# Patient Record
Sex: Female | Born: 1975 | Race: White | Hispanic: No | Marital: Married | Smoking: Never smoker
Health system: Southern US, Community
[De-identification: ages and names within clinical notes are randomized; demographics above are authoritative.]

## PROBLEM LIST (undated history)

## (undated) DIAGNOSIS — G43909 Migraine, unspecified, not intractable, without status migrainosus: Secondary | ICD-10-CM

## (undated) DIAGNOSIS — F419 Anxiety disorder, unspecified: Secondary | ICD-10-CM

## (undated) DIAGNOSIS — R112 Nausea with vomiting, unspecified: Secondary | ICD-10-CM

## (undated) DIAGNOSIS — R002 Palpitations: Secondary | ICD-10-CM

## (undated) DIAGNOSIS — E079 Disorder of thyroid, unspecified: Secondary | ICD-10-CM

## (undated) DIAGNOSIS — I471 Supraventricular tachycardia, unspecified: Secondary | ICD-10-CM

## (undated) DIAGNOSIS — F32A Depression, unspecified: Secondary | ICD-10-CM

## (undated) HISTORY — DX: Migraine, unspecified, not intractable, without status migrainosus: G43.909

## (undated) HISTORY — PX: COLONOSCOPY: SHX174

## (undated) HISTORY — DX: Disorder of thyroid, unspecified: E07.9

## (undated) HISTORY — DX: Anxiety disorder, unspecified: F41.9

---
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
## 2001-01-06 ENCOUNTER — Other Ambulatory Visit: Admission: RE | Admit: 2001-01-06 | Discharge: 2001-01-06 | Payer: Self-pay | Admitting: Obstetrics and Gynecology

## 2001-11-19 ENCOUNTER — Emergency Department (HOSPITAL_COMMUNITY): Admission: EM | Admit: 2001-11-19 | Discharge: 2001-11-19 | Payer: Self-pay | Admitting: Emergency Medicine

## 2002-01-11 ENCOUNTER — Other Ambulatory Visit: Admission: RE | Admit: 2002-01-11 | Discharge: 2002-01-11 | Payer: Self-pay | Admitting: Obstetrics and Gynecology

## 2002-01-16 ENCOUNTER — Encounter: Payer: Self-pay | Admitting: Obstetrics and Gynecology

## 2002-01-16 ENCOUNTER — Encounter: Admission: RE | Admit: 2002-01-16 | Discharge: 2002-01-16 | Payer: Self-pay | Admitting: Obstetrics and Gynecology

## 2003-01-26 ENCOUNTER — Other Ambulatory Visit: Admission: RE | Admit: 2003-01-26 | Discharge: 2003-01-26 | Payer: Self-pay | Admitting: Obstetrics and Gynecology

## 2003-12-15 ENCOUNTER — Inpatient Hospital Stay (HOSPITAL_COMMUNITY): Admission: AD | Admit: 2003-12-15 | Discharge: 2003-12-15 | Payer: Self-pay | Admitting: Obstetrics and Gynecology

## 2004-01-29 ENCOUNTER — Other Ambulatory Visit: Admission: RE | Admit: 2004-01-29 | Discharge: 2004-01-29 | Payer: Self-pay | Admitting: Obstetrics and Gynecology

## 2004-01-31 ENCOUNTER — Ambulatory Visit (HOSPITAL_COMMUNITY): Admission: RE | Admit: 2004-01-31 | Discharge: 2004-01-31 | Payer: Self-pay | Admitting: Obstetrics and Gynecology

## 2005-01-30 ENCOUNTER — Other Ambulatory Visit: Admission: RE | Admit: 2005-01-30 | Discharge: 2005-01-30 | Payer: Self-pay | Admitting: Obstetrics and Gynecology

## 2006-01-26 ENCOUNTER — Other Ambulatory Visit: Admission: RE | Admit: 2006-01-26 | Discharge: 2006-01-26 | Payer: Self-pay | Admitting: Obstetrics and Gynecology

## 2007-02-18 ENCOUNTER — Inpatient Hospital Stay (HOSPITAL_COMMUNITY): Admission: AD | Admit: 2007-02-18 | Discharge: 2007-02-20 | Payer: Self-pay | Admitting: Obstetrics and Gynecology

## 2008-11-30 HISTORY — PX: OTHER SURGICAL HISTORY: SHX169

## 2009-04-10 ENCOUNTER — Encounter: Admission: RE | Admit: 2009-04-10 | Discharge: 2009-04-10 | Payer: Self-pay | Admitting: Family Medicine

## 2009-09-11 ENCOUNTER — Ambulatory Visit (HOSPITAL_COMMUNITY): Admission: AD | Admit: 2009-09-11 | Discharge: 2009-09-11 | Payer: Self-pay | Admitting: Obstetrics and Gynecology

## 2009-09-11 ENCOUNTER — Encounter (INDEPENDENT_AMBULATORY_CARE_PROVIDER_SITE_OTHER): Payer: Self-pay | Admitting: Obstetrics and Gynecology

## 2010-10-31 ENCOUNTER — Inpatient Hospital Stay (HOSPITAL_COMMUNITY): Admission: AD | Admit: 2010-10-31 | Discharge: 2010-11-02 | Payer: Self-pay | Admitting: Obstetrics and Gynecology

## 2010-11-04 ENCOUNTER — Encounter
Admission: RE | Admit: 2010-11-04 | Discharge: 2010-12-04 | Payer: Self-pay | Source: Home / Self Care | Attending: Obstetrics and Gynecology | Admitting: Obstetrics and Gynecology

## 2011-02-09 LAB — CBC
HCT: 33.1 % — ABNORMAL LOW (ref 36.0–46.0)
HCT: 39.4 % (ref 36.0–46.0)
Hemoglobin: 11.2 g/dL — ABNORMAL LOW (ref 12.0–15.0)
Hemoglobin: 13.1 g/dL (ref 12.0–15.0)
MCV: 93.3 fL (ref 78.0–100.0)
Platelets: 166 10*3/uL (ref 150–400)
RBC: 3.55 MIL/uL — ABNORMAL LOW (ref 3.87–5.11)
WBC: 14.1 10*3/uL — ABNORMAL HIGH (ref 4.0–10.5)
WBC: 17.9 10*3/uL — ABNORMAL HIGH (ref 4.0–10.5)

## 2011-03-05 LAB — CBC
Hemoglobin: 12.6 g/dL (ref 12.0–15.0)
MCHC: 34.2 g/dL (ref 30.0–36.0)
MCV: 89.4 fL (ref 78.0–100.0)
RBC: 4.13 MIL/uL (ref 3.87–5.11)

## 2013-02-16 ENCOUNTER — Other Ambulatory Visit: Payer: Self-pay | Admitting: Obstetrics and Gynecology

## 2013-02-17 ENCOUNTER — Ambulatory Visit
Admission: RE | Admit: 2013-02-17 | Discharge: 2013-02-17 | Disposition: A | Discharge status: Home / Self Care | Payer: BC Managed Care – PPO | Source: Ambulatory Visit | Attending: Obstetrics and Gynecology | Admitting: Obstetrics and Gynecology

## 2013-02-17 DIAGNOSIS — N63 Unspecified lump in unspecified breast: Secondary | ICD-10-CM

## 2013-04-14 ENCOUNTER — Other Ambulatory Visit: Payer: Self-pay | Admitting: Dermatology

## 2013-06-19 ENCOUNTER — Ambulatory Visit: Payer: Self-pay | Admitting: Obstetrics and Gynecology

## 2013-06-30 ENCOUNTER — Encounter: Payer: Self-pay | Admitting: Obstetrics and Gynecology

## 2013-06-30 ENCOUNTER — Ambulatory Visit (INDEPENDENT_AMBULATORY_CARE_PROVIDER_SITE_OTHER): Payer: BC Managed Care – PPO | Admitting: Obstetrics and Gynecology

## 2013-06-30 VITALS — BP 100/60 | HR 64 | Ht 63.5 in | Wt 126.0 lb

## 2013-06-30 DIAGNOSIS — Z8669 Personal history of other diseases of the nervous system and sense organs: Secondary | ICD-10-CM

## 2013-06-30 DIAGNOSIS — Z0000 Encounter for general adult medical examination without abnormal findings: Secondary | ICD-10-CM

## 2013-06-30 DIAGNOSIS — Z01419 Encounter for gynecological examination (general) (routine) without abnormal findings: Secondary | ICD-10-CM

## 2013-06-30 LAB — HEMOGLOBIN, FINGERSTICK: Hemoglobin, fingerstick: 14 g/dL (ref 12.0–16.0)

## 2013-06-30 LAB — POCT URINALYSIS DIPSTICK
Ketones, UA: NEGATIVE
Protein, UA: NEGATIVE

## 2013-06-30 NOTE — Progress Notes (Signed)
Patient ID: Katelyn George, female   DOB: 09-11-1976, 37 y.o.   MRN: [id number] 37 y.o.   Married    Caucasian   female   [id number]   here for annual exam.    Has migraine headaches.  Gets headaches a lot - uses medication 5 - 6 times a month. Doesn't tolerate Imitrex. Excedrin migraine has worked.    Prozac 20 mg has been working well.  See a psychiatrist, Dr. Nolen Mu.  Normal menses, every 3 - 4 weeks.  No significant bleeding or cramping. Vasectomy for birth control.  Patient's last menstrual period was 06/12/2013.          Sexually active: yes  The current method of family planning is vasectomy.    Exercising: running and pilates Last mammogram:  02/2013 wnl Last pap smear: 05/2012 wnl: no HR HPV done History of abnormal pap: 16 years ago had colposcopy with cryotherapy and paps normal since. Smoking: no Alcohol: 1-2 beers per week Last colonoscopy: never Last Bone Density:  never Last tetanus shot: 2009 Last cholesterol check: 2013 - T chol - 167, HDL 56, LDL 97, TG 69. TSH, CBC, CMP, HgbA1C  - 2013 also normal  Hgb:  14.0              Urine: Neg   Family History  Problem Relation Age of Onset  . Thyroid disease Mother   . Migraines Maternal Grandmother   . Diabetes Maternal Grandfather   . Stroke Maternal Grandfather     There are no active problems to display for this patient.   Past Medical History  Diagnosis Date  . Migraines   . Anxiety     Past Surgical History  Procedure Laterality Date  . Cervix lesion destruction  1990  . Dilatation and curettage  2010    Allergies: Sulfa antibiotics  Current Outpatient Prescriptions  Medication Sig Dispense Refill  . butalbital-acetaminophen-caffeine (FIORICET WITH CODEINE) 50-325-40-30 MG per capsule Take 1 capsule by mouth every 4 (four) hours as needed for headache.      Marland Kitchen FLUoxetine (PROZAC) 20 MG capsule Take 1 capsule by mouth daily.       No current facility-administered medications for this visit.     ROS: Pertinent items are noted in HPI.  Social Hx:  Married.  Full time mom.  Exam:    BP 100/60  Pulse 64  Ht 5' 3.5" (1.613 m)  Wt 126 lb (57.153 kg)  BMI 21.97 kg/m2  LMP 06/12/2013   Wt Readings from Last 3 Encounters:  06/30/13 126 lb (57.153 kg)     Ht Readings from Last 3 Encounters:  06/30/13 5' 3.5" (1.613 m)    General appearance: alert, cooperative and appears stated age Head: Normocephalic, without obvious abnormality, atraumatic Neck: no adenopathy, supple, symmetrical, trachea midline and thyroid not enlarged, symmetric, no tenderness/mass/nodules Lungs: clear to auscultation bilaterally Breasts: Inspection negative, No nipple retraction or dimpling, No nipple discharge or bleeding, No axillary or supraclavicular adenopathy, Normal to palpation without dominant masses Heart: regular rate and rhythm Abdomen: soft, non-tender; no masses,  no organomegaly Extremities: extremities normal, atraumatic, no cyanosis or edema Skin: Skin color, texture, turgor normal. No rashes or lesions Lymph nodes: Cervical, supraclavicular, and axillary nodes normal. No abnormal inguinal nodes palpated Neurologic: Grossly normal   Pelvic: External genitalia:  no lesions              Urethra:  normal appearing urethra with no masses, tenderness or lesions  Bartholins and Skenes: normal                 Vagina: normal appearing vagina with normal color and discharge, no lesions              Cervix: normal appearance              Pap taken: yes and high risk HPV testing.        Bimanual Exam:  Uterus:  uterus is normal size, shape, consistency and nontender                                      Adnexa: normal adnexa in size, nontender and no masses                                         Assessment  Normal gynecologic exam. Migraine headaches.   Plan  Pap and high risk HPV testing. [facility].  An After Visit Summary was  printed and given to the patient.

## 2013-06-30 NOTE — Patient Instructions (Signed)

## 2013-07-03 ENCOUNTER — Other Ambulatory Visit: Payer: Self-pay | Admitting: Obstetrics and Gynecology

## 2013-07-03 DIAGNOSIS — Z124 Encounter for screening for malignant neoplasm of cervix: Secondary | ICD-10-CM

## 2013-07-05 NOTE — Addendum Note (Signed)
Addended by: Conley Simmonds on: 07/05/2013 08:41 AM   Modules accepted: Orders

## 2013-07-17 ENCOUNTER — Telehealth: Payer: Self-pay | Admitting: Obstetrics and Gynecology

## 2013-07-17 NOTE — Telephone Encounter (Signed)
Patient calling for pap results please?

## 2013-07-17 NOTE — Telephone Encounter (Signed)
Spoke with pt to advise her Pap was negative, as well as HPV testing. Pt appreciative.

## 2013-10-05 ENCOUNTER — Other Ambulatory Visit: Payer: Self-pay

## 2014-07-02 ENCOUNTER — Ambulatory Visit: Payer: BC Managed Care – PPO | Admitting: Obstetrics and Gynecology

## 2014-07-04 ENCOUNTER — Ambulatory Visit: Payer: BC Managed Care – PPO | Admitting: Obstetrics and Gynecology

## 2014-08-10 ENCOUNTER — Ambulatory Visit: Payer: BC Managed Care – PPO | Admitting: Obstetrics and Gynecology

## 2014-08-29 ENCOUNTER — Encounter: Payer: Self-pay | Admitting: Obstetrics and Gynecology

## 2014-08-29 ENCOUNTER — Ambulatory Visit (INDEPENDENT_AMBULATORY_CARE_PROVIDER_SITE_OTHER): Payer: PRIVATE HEALTH INSURANCE | Admitting: Obstetrics and Gynecology

## 2014-08-29 VITALS — BP 98/60 | HR 80 | Resp 20 | Ht 63.0 in | Wt 137.0 lb

## 2014-08-29 DIAGNOSIS — Z0000 Encounter for general adult medical examination without abnormal findings: Secondary | ICD-10-CM

## 2014-08-29 DIAGNOSIS — Z01419 Encounter for gynecological examination (general) (routine) without abnormal findings: Secondary | ICD-10-CM

## 2014-08-29 LAB — CBC
HEMATOCRIT: 41.1 % (ref 36.0–46.0)
Hemoglobin: 13.6 g/dL (ref 12.0–15.0)
MCH: 29.1 pg (ref 26.0–34.0)
MCHC: 33.1 g/dL (ref 30.0–36.0)
MCV: 88 fL (ref 78.0–100.0)
Platelets: 333 10*3/uL (ref 150–400)
RBC: 4.67 MIL/uL (ref 3.87–5.11)
RDW: 13.7 % (ref 11.5–15.5)
WBC: 6.6 10*3/uL (ref 4.0–10.5)

## 2014-08-29 LAB — POCT URINALYSIS DIPSTICK
BILIRUBIN UA: NEGATIVE
Glucose, UA: NEGATIVE
KETONES UA: NEGATIVE
Leukocytes, UA: NEGATIVE
Nitrite, UA: NEGATIVE
PH UA: 5
PROTEIN UA: NEGATIVE
RBC UA: NEGATIVE
Urobilinogen, UA: NEGATIVE

## 2014-08-29 LAB — COMPREHENSIVE METABOLIC PANEL
ALK PHOS: 43 U/L (ref 39–117)
AST: 16 U/L (ref 0–37)
Albumin: 4.4 g/dL (ref 3.5–5.2)
BILIRUBIN TOTAL: 0.5 mg/dL (ref 0.2–1.2)
BUN: 9 mg/dL (ref 6–23)
CO2: 22 mEq/L (ref 19–32)
CREATININE: 0.94 mg/dL (ref 0.50–1.10)
Calcium: 9.5 mg/dL (ref 8.4–10.5)
Chloride: 101 mEq/L (ref 96–112)
Glucose, Bld: 84 mg/dL (ref 70–99)
Potassium: 4.9 mEq/L (ref 3.5–5.3)
SODIUM: 137 meq/L (ref 135–145)
TOTAL PROTEIN: 6.7 g/dL (ref 6.0–8.3)

## 2014-08-29 LAB — LIPID PANEL
Cholesterol: 178 mg/dL (ref 0–200)
HDL: 66 mg/dL (ref >39)
LDL CALC: 88 mg/dL (ref 0–99)
TRIGLYCERIDES: 122 mg/dL (ref <150)
Total CHOL/HDL Ratio: 2.7 Ratio
VLDL: 24 mg/dL (ref 0–40)

## 2014-08-29 LAB — HEMOGLOBIN, FINGERSTICK: Hemoglobin, fingerstick: 13.5 g/dL (ref 12.0–16.0)

## 2014-08-29 NOTE — Progress Notes (Signed)
Patient ID: Katelyn FussSarah R Pedrosa, female   DOB: 03/04/1976, 38 y.o.   MRN: [id number] GYNECOLOGY VISIT  PCP:   Referring provider:   HPI: 38 y.o.   Married  Caucasian  female   [phone] with Patient's last menstrual period was 08/29/2014.  - just starting.  here for  AEX.  Clotting a little bit more with menses.   History of postpartum depression.   Patient is wanting general blood work.  Father is now pre-diabetic.   Hgb:    13.5 Urine:   Neg  GYNECOLOGIC HISTORY: Patient's last menstrual period was 08/29/2014. Sexually active:  yes Partner preference: female Contraception:  vasectomy  Menopausal hormone therapy: n/a DES exposure: no   Blood transfusions:  no  Sexually transmitted diseases:  no GYN procedures and prior surgeries:  Colposcopy and cryotherapy to cervix 1998 Last mammogram: 02/2013 wnl (done for palpable mass on exam)              Last pap and high risk HPV testing: 07-05-13 wnl:neg HR HPV History of abnormal pap smear: 1998 hx of colposcopy and cryotherapy to cervix. - thinks this was for mild dysplasia.  Follow up paps were normal.    OB History   Grav Para Term Preterm Abortions TAB SAB Ect Mult Living   3 2 2  1  1   2        LIFESTYLE: Exercise:  Running/walking/pilates              OTHER HEALTH MAINTENANCE: Tetanus/TDap:  2009 HPV:                  n/a Influenza:           08/2013   Bone density:      n/a Colonoscopy:      n/a  Cholesterol check:  2013 - Total:167, HDL 56, LDL:97, TG:69  Family History  Problem Relation Age of Onset  . Thyroid disease Mother   . Migraines Maternal Grandmother   . Diabetes Maternal Grandfather   . Stroke Maternal Grandfather     There are no active problems to display for this patient.  Past Medical History  Diagnosis Date  . Migraines   . Anxiety     Past Surgical History  Procedure Laterality Date  . Cervix lesion destruction  1990  . Dilatation and curettage  2010    ALLERGIES: Sulfa  antibiotics  Current Outpatient Prescriptions  Medication Sig Dispense Refill  . FLUoxetine (PROZAC) 20 MG capsule Take 1 capsule by mouth daily.       No current facility-administered medications for this visit.     ROS:  Pertinent items are noted in HPI.  History   Social History  . Marital Status: Married    Spouse Name: N/A    Number of Children: N/A  . Years of Education: N/A   Occupational History  . Not on file.   Social History Main Topics  . Smoking status: Never Smoker   . Smokeless tobacco: Not on file  . Alcohol Use: 0.5 oz/week    1 drink(s) per week     Comment: 1-2 glasses of wine per week  . Drug Use: No  . Sexual Activity: Yes    Partners: Male    Birth Control/ Protection: Other-see comments     Comment: vasectomy   Other Topics Concern  . Not on file   Social History Narrative  . No narrative on file    PHYSICAL EXAMINATION:  BP 98/60  Pulse 80  Resp 20  Ht 5\' 3"  (1.6 m)  Wt 137 lb (62.143 kg)  BMI 24.27 kg/m2  LMP 08/29/2014   Wt Readings from Last 3 Encounters:  08/29/14 137 lb (62.143 kg)  06/30/13 126 lb (57.153 kg)     Ht Readings from Last 3 Encounters:  08/29/14 5\' 3"  (1.6 m)  06/30/13 5' 3.5" (1.613 m)    General appearance: alert, cooperative and appears stated age Head: Normocephalic, without obvious abnormality, atraumatic Neck: no adenopathy, supple, symmetrical, trachea midline and thyroid not enlarged, symmetric, no tenderness/mass/nodules Lungs: clear to auscultation bilaterally Breasts: Inspection negative, No nipple retraction or dimpling, No nipple discharge or bleeding, No axillary or supraclavicular adenopathy, Normal to palpation without dominant masses Heart: regular rate and rhythm Abdomen: soft, non-tender; no masses,  no organomegaly Extremities: extremities normal, atraumatic, no cyanosis or edema Skin: Skin color, texture, turgor normal. No rashes or lesions Lymph nodes: Cervical, supraclavicular, and  axillary nodes normal. No abnormal inguinal nodes palpated Neurologic: Grossly normal  Pelvic: External genitalia:  no lesions              Urethra:  normal appearing urethra with no masses, tenderness or lesions              Bartholins and Skenes: normal                 Vagina: normal appearing vagina with normal color and discharge, no lesions              Cervix: normal appearance              Pap and high risk HPV testing done: No.        Bimanual Exam:  Uterus:  uterus is normal size, shape, consistency and nontender                                      Adnexa: normal adnexa in size, nontender and no masses                                  ASSESSMENT  Normal gynecologic exam. Remote history of low grade dysplasia.   PLAN  Mammogram recommended yearly starting at age 29. Pap smear and high risk HPV testing as above. Counseled on self breast exam, Calcium and vitamin D intake, exercise. See lab orders: Yes.   Return annually or prn   An After Visit Summary was printed and given to the patient.

## 2014-08-29 NOTE — Patient Instructions (Signed)

## 2014-10-01 ENCOUNTER — Encounter: Payer: Self-pay | Admitting: Obstetrics and Gynecology

## 2015-09-11 ENCOUNTER — Ambulatory Visit (INDEPENDENT_AMBULATORY_CARE_PROVIDER_SITE_OTHER): Payer: PRIVATE HEALTH INSURANCE | Admitting: Obstetrics and Gynecology

## 2015-09-11 ENCOUNTER — Encounter: Payer: Self-pay | Admitting: Obstetrics and Gynecology

## 2015-09-11 VITALS — BP 86/60 | HR 84 | Resp 16 | Ht 63.25 in | Wt 147.0 lb

## 2015-09-11 DIAGNOSIS — Z634 Disappearance and death of family member: Secondary | ICD-10-CM | POA: Diagnosis not present

## 2015-09-11 DIAGNOSIS — Z0000 Encounter for general adult medical examination without abnormal findings: Secondary | ICD-10-CM | POA: Diagnosis not present

## 2015-09-11 DIAGNOSIS — R5383 Other fatigue: Secondary | ICD-10-CM | POA: Diagnosis not present

## 2015-09-11 DIAGNOSIS — Z01419 Encounter for gynecological examination (general) (routine) without abnormal findings: Secondary | ICD-10-CM

## 2015-09-11 LAB — COMPREHENSIVE METABOLIC PANEL
ALK PHOS: 53 U/L (ref 33–115)
ALT: 17 U/L (ref 6–29)
AST: 20 U/L (ref 10–30)
Albumin: 4.3 g/dL (ref 3.6–5.1)
BILIRUBIN TOTAL: 0.5 mg/dL (ref 0.2–1.2)
BUN: 12 mg/dL (ref 7–25)
CO2: 28 mmol/L (ref 20–31)
CREATININE: 0.89 mg/dL (ref 0.50–1.10)
Calcium: 9.4 mg/dL (ref 8.6–10.2)
Chloride: 101 mmol/L (ref 98–110)
GLUCOSE: 88 mg/dL (ref 65–99)
POTASSIUM: 4.3 mmol/L (ref 3.5–5.3)
Sodium: 137 mmol/L (ref 135–146)
TOTAL PROTEIN: 7 g/dL (ref 6.1–8.1)

## 2015-09-11 LAB — POCT URINALYSIS DIPSTICK
BILIRUBIN UA: NEGATIVE
GLUCOSE UA: NEGATIVE
Ketones, UA: NEGATIVE
LEUKOCYTES UA: NEGATIVE
NITRITE UA: NEGATIVE
Protein, UA: NEGATIVE
RBC UA: NEGATIVE
UROBILINOGEN UA: NEGATIVE
pH, UA: 5

## 2015-09-11 LAB — LIPID PANEL
CHOL/HDL RATIO: 3 ratio (ref <=5.0)
CHOLESTEROL: 194 mg/dL (ref 125–200)
HDL: 65 mg/dL (ref >=46)
LDL Cholesterol: 104 mg/dL (ref <130)
TRIGLYCERIDES: 126 mg/dL (ref <150)
VLDL: 25 mg/dL (ref <30)

## 2015-09-11 LAB — CBC
HCT: 40.3 % (ref 36.0–46.0)
Hemoglobin: 13.7 g/dL (ref 12.0–15.0)
MCH: 29.5 pg (ref 26.0–34.0)
MCHC: 34 g/dL (ref 30.0–36.0)
MCV: 86.9 fL (ref 78.0–100.0)
MPV: 9.7 fL (ref 8.6–12.4)
PLATELETS: 308 10*3/uL (ref 150–400)
RBC: 4.64 MIL/uL (ref 3.87–5.11)
RDW: 13.6 % (ref 11.5–15.5)
WBC: 5.9 10*3/uL (ref 4.0–10.5)

## 2015-09-11 LAB — THYROID PANEL WITH TSH
FREE THYROXINE INDEX: 1.8 (ref 1.4–3.8)
T3 UPTAKE: 26 % (ref 22–35)
T4, Total: 6.8 ug/dL (ref 4.5–12.0)
TSH: 3.219 u[IU]/mL (ref 0.350–4.500)

## 2015-09-11 LAB — HEMOGLOBIN, FINGERSTICK: HEMOGLOBIN, FINGERSTICK: 13.7 g/dL (ref 12.0–16.0)

## 2015-09-11 NOTE — Progress Notes (Signed)
39 y.o. G45P2012 Married Caucasian female here for annual exam.    Father deceased in 2015/04/02.  Suicide.  Patient is an only child.  Taking care of her mother.   Taking Prozac and feels stable at this time.  Has a counselor and religious support group.  No suicidal thoughts or ideation.   Feels really tired.   Wants thyroid checked.   Menses wax and wane in heaviness.  Nothing concerning for patient.  Cycles are regular.  PCP: No PCP    Patient's last menstrual period was 08/21/2015 (approximate).          Sexually active: Yes.    The current method of family planning is vasectomy.    Exercising: Yes.    Pilates, walking Smoker:  no  Health Maintenance: Pap:  07/05/13 Neg. HR HPV:neg History of abnormal Pap:  Yes, 1998 colpo and cryotherapy. Mild dysplasia MMG:  02/17/13 [place] Right BIRADS1:neg TDaP:  04/01/08 Screening Labs:  Hb today: 13.7, Urine today: Negative   reports that she has never smoked. She has never used smokeless tobacco. She reports that she drinks about 0.5 oz of alcohol per week. She reports that she does not use illicit drugs.  Past Medical History  Diagnosis Date  . Migraines   . Anxiety     Past Surgical History  Procedure Laterality Date  . Cervix lesion destruction  1990  . Dilatation and curettage  04-01-2009    Current Outpatient Prescriptions  Medication Sig Dispense Refill  . FLUoxetine (PROZAC) 20 MG capsule Take 1 capsule by mouth daily.    Marland Kitchen gabapentin (NEURONTIN) 100 MG capsule Take 100 mg by mouth at bedtime as needed. for sleep  5  . vitamin B-12 (CYANOCOBALAMIN) 100 MCG tablet Take 100 mcg by mouth daily.     No current facility-administered medications for this visit.    Family History  Problem Relation Age of Onset  . Thyroid disease Mother   . Migraines Maternal Grandmother   . Diabetes Maternal Grandfather   . Stroke Maternal Grandfather   . Depression Father     suicide  . Bipolar disorder Father     ROS:  Pertinent items are  noted in HPI.  Otherwise, a comprehensive ROS was negative.  Exam:   BP 86/60 mmHg  Pulse 84  Resp 16  Ht 5' 3.25" (1.607 m)  Wt 147 lb (66.679 kg)  BMI 25.82 kg/m2  LMP 08/21/2015 (Approximate)    General appearance: alert, cooperative and appears stated age Head: Normocephalic, without obvious abnormality, atraumatic Neck: no adenopathy, supple, symmetrical, trachea midline and thyroid normal to inspection and palpation Lungs: clear to auscultation bilaterally Breasts: normal appearance, no masses or tenderness, Inspection negative, No nipple retraction or dimpling, No nipple discharge or bleeding, No axillary or supraclavicular adenopathy Heart: regular rate and rhythm Abdomen: soft, non-tender; bowel sounds normal; no masses,  no organomegaly Extremities: extremities normal, atraumatic, no cyanosis or edema Skin: Skin color, texture, turgor normal. No rashes or lesions Lymph nodes: Cervical, supraclavicular, and axillary nodes normal. No abnormal inguinal nodes palpated Neurologic: Grossly normal  Pelvic: External genitalia:  no lesions              Urethra:  normal appearing urethra with no masses, tenderness or lesions              Bartholins and Skenes: normal                 Vagina: normal appearing vagina with normal color  and discharge, no lesions              Cervix: no lesions              Pap taken: No. Bimanual Exam:  Uterus:  normal size, contour, position, consistency, mobility, non-tender.  Uterus feels like it is tilted to the right so I feel the fundus more on the right side of pelvis.              Adnexa: normal adnexa and no mass, fullness, tenderness              Rectovaginal: No..  Confirms.              Anus:  normal sphincter tone, no lesions  Chaperone was present for exam.  Assessment:   Well woman visit with normal exam. Bereavement.  Depression/anxiety.  Controlled with Prozac.   Fatigue.  Remote hx of cervical dysplasia.   Plan: Yearly  mammogram recommended after age 39.  Recommended self breast exam.  Pap and HR HPV as above.  Co-testing next year. Discussed Calcium, Vitamin D, regular exercise program including cardiovascular and weight bearing exercise. Labs performed.  Yes.  .   See orders. Support give for death of father.  Refills given on medications.  No..    Follow up annually and prn.    After visit summary provided.

## 2015-09-11 NOTE — Patient Instructions (Signed)

## 2015-09-12 ENCOUNTER — Other Ambulatory Visit: Payer: Self-pay | Admitting: Obstetrics and Gynecology

## 2015-09-12 DIAGNOSIS — E559 Vitamin D deficiency, unspecified: Secondary | ICD-10-CM

## 2015-09-12 LAB — VITAMIN D 25 HYDROXY (VIT D DEFICIENCY, FRACTURES): VIT D 25 HYDROXY: 28 ng/mL — AB (ref 30–100)

## 2016-01-20 ENCOUNTER — Telehealth: Payer: Self-pay

## 2016-01-20 NOTE — Telephone Encounter (Signed)
Spoke with patient. Advised of message as seen below from Dr.Silva. Patient states she will need to check her calendar and return call to schedule.  Routing to provider for final review. Patient agreeable to disposition. Will close encounter.

## 2016-01-20 NOTE — Telephone Encounter (Signed)
-----   MesPatton Sallesndson C Silva, MD sent at 01/18/2016 10:03 AM EST ----- Regarding: overdue for vit D recheck Please let patient know that she is overdue for her vitamin D recheck.  This came up in my reminder box in [employer].   Thank you,   Brook ----- Message -----    From: SYSTEM    Sent: 01/18/2016  12:04 AM      To: Patton Salles, MD

## 2016-09-28 NOTE — Progress Notes (Deleted)
40 y.o. 413P2012 Married Caucasian female here for annual exam.    PCP:     No LMP recorded.           Sexually active: {yes no:314532}  The current method of family planning is vasectomy.    Exercising: {yes no:314532}  {types:19826} Smoker:  no  Health Maintenance: Pap: 07/05/13 Neg. HR HPV:neg  History of abnormal Pap:  Yes, 1998 colpo and cryotherapy. Mild dysplasia MMG:  02-17-13 Bil.Diag.and Rt.US--Cat.3/Neg/BiRads1/rec.screening at age 40:[facility] Colonoscopy:  n/a BMD:   n/a  Result  n/a TDaP:  2009 Gardasil:   {YES NO:22349} HIV: Hep C: Screening Labs:  Hb today: ***, Urine today: ***   reports that she has never smoked. She has never used smokeless tobacco. She reports that she drinks about 0.5 oz of alcohol per week . She reports that she does not use drugs.  Past Medical History:  Diagnosis Date  . Anxiety   . Migraines     Past Surgical History:  Procedure Laterality Date  . CERVIX LESION DESTRUCTION  1990  . dilatation and curettage  2010    Current Outpatient Prescriptions  Medication Sig Dispense Refill  . FLUoxetine (PROZAC) 20 MG capsule Take 1 capsule by mouth daily.    Marland Kitchen. gabapentin (NEURONTIN) 100 MG capsule Take 100 mg by mouth at bedtime as needed. for sleep  5  . vitamin B-12 (CYANOCOBALAMIN) 100 MCG tablet Take 100 mcg by mouth daily.     No current facility-administered medications for this visit.     Family History  Problem Relation Age of Onset  . Thyroid disease Mother   . Migraines Maternal Grandmother   . Diabetes Maternal Grandfather   . Stroke Maternal Grandfather   . Depression Father     suicide  . Bipolar disorder Father     ROS:  Pertinent items are noted in HPI.  Otherwise, a comprehensive ROS was negative.  Exam:   There were no vitals taken for this visit.    General appearance: alert, cooperative and appears stated age Head: Normocephalic, without obvious abnormality, atraumatic Neck: no adenopathy, supple,  symmetrical, trachea midline and thyroid normal to inspection and palpation Lungs: clear to auscultation bilaterally Breasts: normal appearance, no masses or tenderness, No nipple retraction or dimpling, No nipple discharge or bleeding, No axillary or supraclavicular adenopathy Heart: regular rate and rhythm Abdomen: soft, non-tender; no masses, no organomegaly Extremities: extremities normal, atraumatic, no cyanosis or edema Skin: Skin color, texture, turgor normal. No rashes or lesions Lymph nodes: Cervical, supraclavicular, and axillary nodes normal. No abnormal inguinal nodes palpated Neurologic: Grossly normal  Pelvic: External genitalia:  no lesions              Urethra:  normal appearing urethra with no masses, tenderness or lesions              Bartholins and Skenes: normal                 Vagina: normal appearing vagina with normal color and discharge, no lesions              Cervix: no lesions              Pap taken: {yes no:314532} Bimanual Exam:  Uterus:  normal size, contour, position, consistency, mobility, non-tender              Adnexa: no mass, fullness, tenderness              Rectal  exam: {yes no:314532}.  Confirms.              Anus:  normal sphincter tone, no lesions  Chaperone was present for exam.  Assessment:   Well woman visit with normal exam.   Plan: Yearly mammogram recommended after age 40.  Recommended self breast exam.  Pap and HR HPV as above. Discussed Calcium, Vitamin D, regular exercise program including cardiovascular and weight bearing exercise.   Follow up annually and prn.   Additional counseling given.  {yes [id number]:314532}. _______ minutes face to face time of which over 50% was spent in counseling.    After visit summary provided.

## 2016-09-30 ENCOUNTER — Ambulatory Visit: Payer: PRIVATE HEALTH INSURANCE | Admitting: Obstetrics and Gynecology

## 2016-11-09 NOTE — Progress Notes (Signed)
40 y.o. 523P2012 Married Caucasian female here for annual exam.    Saw PCP and did routine book.  Now on Synthroid for hypothyroidism.  No anemia.   Doing a new change in her diet to be more healthy.  Likes not being on birth control.  PCP:   Kari BaarsW.Douglas Shaw, MD  Patient's last menstrual period was 10/23/2016 (exact date).     Period Cycle (Days): 30 Period Duration (Days): 5 Period Pattern: Regular Menstrual Flow:  (light to heavy--each month is different) Menstrual Control: Tampon Menstrual Control Change Freq (Hours): every 2 hrs on heaviest day Dysmenorrhea: (!) Mild Dysmenorrhea Symptoms: Cramping (occ. headache and has clots)     Sexually active: Yes.   FEMALE The current method of family planning is vasectomy.    Exercising: Yes.    Barre and walking Smoker:  no  Health Maintenance: Pap:  07-05-13 Neg:Neg HR HPV History of abnormal Pap:  Yes,1998 colpo and cryotherapy. Mild dysplasia.  MMG:  07-20-13 Diag.Bil.w/Rt.Br.U/S--Density 3/Neg/normal fibroglandular tissue in Rt.Br./BiRads1:TBC Colonoscopy:  n/a BMD:   n/a  Result  n/a TDaP:  2009 Gardasil:   n/a HIV:  Neg in pregnancy. Hep C:  NA Screening Labs:  Hb today: PCP, Urine today: unable to void   reports that she has never smoked. She has never used smokeless tobacco. She reports that she drinks about 0.5 oz of alcohol per week . She reports that she does not use drugs.  Past Medical History:  Diagnosis Date  . Anxiety   . Migraines   . Thyroid disease    hyperthyroidism    Past Surgical History:  Procedure Laterality Date  . CERVIX LESION DESTRUCTION  1990  . dilatation and curettage  2010    Current Outpatient Prescriptions  Medication Sig Dispense Refill  . acetaZOLAMIDE (DIAMOX) 125 MG tablet Take 1 tablet by mouth as needed. TAKES FOR MOTION SICKNESS  3  . FLUoxetine (PROZAC) 20 MG capsule Take 1 capsule by mouth daily.    Marland Kitchen. gabapentin (NEURONTIN) 100 MG capsule Take 100 mg by mouth at bedtime as  needed. for sleep  5  . SYNTHROID 50 MCG tablet Take 50 mcg by mouth every morning.  6  . tretinoin (RETIN-A) 0.025 % cream Apply 1 application topically as needed.  6   No current facility-administered medications for this visit.     Family History  Problem Relation Age of Onset  . Thyroid disease Mother   . Depression Father     suicide  . Bipolar disorder Father   . Migraines Maternal Grandmother   . Diabetes Maternal Grandfather   . Stroke Maternal Grandfather     ROS:  Pertinent items are noted in HPI.  Otherwise, a comprehensive ROS was negative.  Exam:   BP 100/62 (BP Location: Right Arm, Patient Position: Sitting, Cuff Size: Normal)   Pulse 70   Resp 14   Ht 5\' 4"  (1.626 m)   Wt 141 lb 3.2 oz (64 kg)   LMP 10/23/2016 (Exact Date)   BMI 24.24 kg/m     General appearance: alert, cooperative and appears stated age Head: Normocephalic, without obvious abnormality, atraumatic Neck: no adenopathy, supple, symmetrical, trachea midline and thyroid normal to inspection and palpation Lungs: clear to auscultation bilaterally Breasts: normal appearance, no masses or tenderness, No nipple retraction or dimpling, No nipple discharge or bleeding, No axillary or supraclavicular adenopathy Heart: regular rate and rhythm Abdomen: soft, non-tender; no masses, no organomegaly Extremities: extremities normal, atraumatic, no cyanosis or edema  Skin: Skin color, texture, turgor normal. No rashes or lesions Lymph nodes: Cervical, supraclavicular, and axillary nodes normal. No abnormal inguinal nodes palpated Neurologic: Grossly normal  Pelvic: External genitalia:  no lesions              Urethra:  normal appearing urethra with no masses, tenderness or lesions              Bartholins and Skenes: normal                 Vagina: normal appearing vagina with normal color and discharge, no lesions              Cervix: no lesions              Pap taken: Yes.   Bimanual Exam:  Uterus:  normal  size, contour, position, consistency, mobility, non-tender              Adnexa: no mass, fullness, tenderness              Rectal exam: Yes.  .  Confirms.              Anus:  normal sphincter tone, no lesions  Chaperone was present for exam.  Assessment:   Well woman visit with normal exam. Remote hx cryotherapy for cervical dysplasia. New dx hypothyroidism.  Plan: Yearly mammogram recommended after age 940.  Recommended self breast exam.  Pap and HR HPV as above. Guidelines Calcium, Vitamin D, regular exercise program including cardiovascular and weight bearing exercise. Synthroid per PCP. Follow up annually and prn.     After visit summary provided.

## 2016-11-11 ENCOUNTER — Encounter: Payer: Self-pay | Admitting: Obstetrics and Gynecology

## 2016-11-11 ENCOUNTER — Ambulatory Visit: Payer: PRIVATE HEALTH INSURANCE | Admitting: Obstetrics and Gynecology

## 2016-11-11 VITALS — BP 100/62 | HR 70 | Resp 14 | Ht 64.0 in | Wt 141.2 lb

## 2016-11-11 DIAGNOSIS — Z01419 Encounter for gynecological examination (general) (routine) without abnormal findings: Secondary | ICD-10-CM

## 2016-11-11 NOTE — Patient Instructions (Signed)

## 2016-11-17 LAB — IPS PAP TEST WITH HPV

## 2016-12-08 ENCOUNTER — Other Ambulatory Visit: Payer: Self-pay | Admitting: Obstetrics and Gynecology

## 2016-12-08 DIAGNOSIS — Z1231 Encounter for screening mammogram for malignant neoplasm of breast: Secondary | ICD-10-CM

## 2017-01-12 ENCOUNTER — Ambulatory Visit
Admission: RE | Admit: 2017-01-12 | Discharge: 2017-01-12 | Disposition: A | Discharge status: Home / Self Care | Payer: No Typology Code available for payment source | Source: Ambulatory Visit | Attending: Obstetrics and Gynecology | Admitting: Obstetrics and Gynecology

## 2017-01-12 DIAGNOSIS — Z1231 Encounter for screening mammogram for malignant neoplasm of breast: Secondary | ICD-10-CM

## 2017-11-19 ENCOUNTER — Ambulatory Visit: Payer: No Typology Code available for payment source | Admitting: Obstetrics and Gynecology

## 2018-02-10 DIAGNOSIS — E039 Hypothyroidism, unspecified: Secondary | ICD-10-CM | POA: Insufficient documentation

## 2018-02-24 ENCOUNTER — Other Ambulatory Visit: Payer: Self-pay | Admitting: Nurse Practitioner

## 2018-02-24 DIAGNOSIS — Z1231 Encounter for screening mammogram for malignant neoplasm of breast: Secondary | ICD-10-CM

## 2018-04-04 ENCOUNTER — Ambulatory Visit
Admission: RE | Admit: 2018-04-04 | Discharge: 2018-04-04 | Disposition: A | Discharge status: Home / Self Care | Payer: No Typology Code available for payment source | Source: Ambulatory Visit | Attending: Nurse Practitioner | Admitting: Nurse Practitioner

## 2018-04-04 DIAGNOSIS — Z1231 Encounter for screening mammogram for malignant neoplasm of breast: Secondary | ICD-10-CM

## 2020-01-22 ENCOUNTER — Other Ambulatory Visit: Payer: Self-pay | Admitting: Nurse Practitioner

## 2020-01-22 DIAGNOSIS — Z1231 Encounter for screening mammogram for malignant neoplasm of breast: Secondary | ICD-10-CM

## 2020-02-21 ENCOUNTER — Ambulatory Visit
Admission: RE | Admit: 2020-02-21 | Discharge: 2020-02-21 | Disposition: A | Discharge status: Home / Self Care | Payer: No Typology Code available for payment source | Source: Ambulatory Visit | Attending: Nurse Practitioner | Admitting: Nurse Practitioner

## 2020-02-21 ENCOUNTER — Other Ambulatory Visit: Payer: Self-pay

## 2020-02-21 DIAGNOSIS — Z1231 Encounter for screening mammogram for malignant neoplasm of breast: Secondary | ICD-10-CM

## 2021-01-20 ENCOUNTER — Other Ambulatory Visit: Payer: Self-pay | Admitting: Nurse Practitioner

## 2021-01-20 DIAGNOSIS — Z1231 Encounter for screening mammogram for malignant neoplasm of breast: Secondary | ICD-10-CM

## 2021-03-24 ENCOUNTER — Ambulatory Visit
Admission: RE | Admit: 2021-03-24 | Discharge: 2021-03-24 | Disposition: A | Discharge status: Home / Self Care | Payer: BC Managed Care – PPO | Source: Ambulatory Visit | Attending: Nurse Practitioner | Admitting: Nurse Practitioner

## 2021-03-24 ENCOUNTER — Other Ambulatory Visit: Payer: Self-pay

## 2021-03-24 DIAGNOSIS — Z1231 Encounter for screening mammogram for malignant neoplasm of breast: Secondary | ICD-10-CM

## 2022-02-19 ENCOUNTER — Other Ambulatory Visit: Payer: Self-pay | Admitting: Nurse Practitioner

## 2022-02-19 DIAGNOSIS — Z1231 Encounter for screening mammogram for malignant neoplasm of breast: Secondary | ICD-10-CM

## 2022-03-25 ENCOUNTER — Ambulatory Visit
Admission: RE | Admit: 2022-03-25 | Discharge: 2022-03-25 | Disposition: A | Discharge status: Home / Self Care | Payer: BC Managed Care – PPO | Source: Ambulatory Visit | Attending: Nurse Practitioner | Admitting: Nurse Practitioner

## 2022-03-25 DIAGNOSIS — Z1231 Encounter for screening mammogram for malignant neoplasm of breast: Secondary | ICD-10-CM

## 2022-03-27 ENCOUNTER — Other Ambulatory Visit: Payer: Self-pay | Admitting: Nurse Practitioner

## 2022-03-27 DIAGNOSIS — R928 Other abnormal and inconclusive findings on diagnostic imaging of breast: Secondary | ICD-10-CM

## 2022-04-07 ENCOUNTER — Ambulatory Visit
Admission: RE | Admit: 2022-04-07 | Discharge: 2022-04-07 | Disposition: A | Discharge status: Home / Self Care | Payer: BC Managed Care – PPO | Source: Ambulatory Visit | Attending: Nurse Practitioner | Admitting: Nurse Practitioner

## 2022-04-07 ENCOUNTER — Ambulatory Visit: Payer: BC Managed Care – PPO

## 2022-04-07 DIAGNOSIS — R928 Other abnormal and inconclusive findings on diagnostic imaging of breast: Secondary | ICD-10-CM

## 2022-11-09 ENCOUNTER — Ambulatory Visit (INDEPENDENT_AMBULATORY_CARE_PROVIDER_SITE_OTHER): Payer: BC Managed Care – PPO | Admitting: Podiatry

## 2022-11-09 VITALS — BP 110/70 | HR 75

## 2022-11-09 DIAGNOSIS — M21619 Bunion of unspecified foot: Secondary | ICD-10-CM

## 2022-11-09 NOTE — Progress Notes (Signed)
Subjective:   Patient ID: Katelyn George, female   DOB: 46 y.o.   MRN: [id number]   HPI Chief Complaint  Patient presents with   Bunions    Right foot bunion, started 20 years ago, 8 months patient started having more pain, rate of pain 7 out of 10, when running and walking a long period of time, X-Rays done today,     46 year old female presents the office today with above concerns.  Started after she got out of college. She has tried changing shoes as she knows he has a flatfoot. She tries to wear good shoes but if she is trail running or on unneven surfaces it really starts to hurt. Now it is starting ot hurt even with tennis shoes.   Negative tobacco Occasional etoh   Review of Systems  All other systems reviewed and are negative.  Past Medical History:  Diagnosis Date   Anxiety    Migraines    Thyroid disease    hyperthyroidism    Past Surgical History:  Procedure Laterality Date   CERVIX LESION DESTRUCTION  1990   dilatation and curettage  2010     Current Outpatient Medications:    acetaZOLAMIDE (DIAMOX) 125 MG tablet, Take 1 tablet by mouth as needed. TAKES FOR MOTION SICKNESS, Disp: , Rfl: 3   FLUoxetine (PROZAC) 20 MG capsule, Take 1 capsule by mouth daily., Disp: , Rfl:    gabapentin (NEURONTIN) 100 MG capsule, Take 100 mg by mouth at bedtime as needed. for sleep, Disp: , Rfl: 5   SYNTHROID 50 MCG tablet, Take 50 mcg by mouth every morning., Disp: , Rfl: 6   tretinoin (RETIN-A) 0.025 % cream, Apply 1 application topically as needed. (Patient not taking: Reported on 11/09/2022), Disp: , Rfl: 6  Allergies  Allergen Reactions   Sulfa Antibiotics Nausea Only          Objective:  Physical Exam  General: AAO x3, NAD  Dermatological: Skin is warm, dry and supple bilateral.  There are no open sores, no preulcerative lesions, no rash or signs of infection present.  Vascular: Dorsalis Pedis artery and Posterior Tibial artery pedal pulses are 2/4 bilateral  with immedate capillary fill time.  There is no pain with calf compression, swelling, warmth, erythema.   Neruologic: Grossly intact via light touch bilateral.   Musculoskeletal: On the right foot there is moderate bunion deformity present.  There is no significant hypermobility the first ray.  No pain or crepitation first MPJ range of motion.  Tenderness on the left first metatarsal head.  Gait: Unassisted, Nonantalgic.       Assessment:   46 year old female with right foot bunion     Plan:  -Treatment options discussed including all alternatives, risks, and complications -Etiology of symptoms were discussed -X-rays were obtained and reviewed with the patient.  3 views of the right foot were obtained.  Moderate increase in first metatarsal angle consistent with a bunion.  No evidence of acute fracture. -We discussed both conservative as well as surgical treatment options.  She is attempted conservative care without any significant improvement she wants to proceed with surgical intervention.  Discussed with her first metatarsal osteotomy and she wishes to proceed. -The incision placement as well as the postoperative course was discussed with the patient. I discussed risks of the surgery which include, but not limited to, infection, bleeding, pain, swelling, need for further surgery, delayed or nonhealing, painful or ugly scar, numbness or sensation changes, over/under correction, recurrence, transfer  lesions, further deformity, hardware failure, DVT/PE, loss of toe/foot. Patient understands these risks and wishes to proceed with surgery. The surgical consent was reviewed with the patient all 3 pages were signed. No promises or guarantees were given to the outcome of the procedure. All questions were answered to the best of my ability. Before the surgery the patient was encouraged to call the office if there is any further questions. The surgery will be performed at the [facility] on an outpatient  basis.  Vivi Barrack DPM

## 2022-11-09 NOTE — Patient Instructions (Signed)
Pre-Operative Instructions  Congratulations, you have decided to take an important step to improving your quality of life.  You can be assured that the doctors of [facility] will be with you every step of the way.  Plan to be at the surgery center/hospital at least 1 (one) hour prior to your scheduled time unless otherwise directed by the surgical center/hospital staff.  You must have a responsible adult accompany you, remain during the surgery and drive you home.  Make sure you have directions to the surgical center/hospital and know how to get there on time. For hospital based surgery you will need to obtain a history and physical form from your family physician within 1 month prior to the date of surgery- we will give you a form for you primary physician.  We make every effort to accommodate the date you request for surgery.  There are however, times where surgery dates or times have to be moved.  We will contact you as soon as possible if a change in schedule is required.   No Aspirin/Ibuprofen for one week before surgery.  If you are on aspirin, any non-steroidal anti-inflammatory medications (Mobic, Aleve, Ibuprofen) you should stop taking it 7 days prior to your surgery.  You make take Tylenol  For pain prior to surgery.  Medications- If you are taking daily heart and blood pressure medications, seizure, reflux, allergy, asthma, anxiety, pain or diabetes medications, make sure the surgery center/hospital is aware before the day of surgery so they may notify you which medications to take or avoid the day of surgery. No food or drink after midnight the night before surgery unless directed otherwise by surgical center/hospital staff. No alcoholic beverages 24 hours prior to surgery.  No smoking 24 hours prior to or 24 hours after surgery. Wear loose pants or shorts- loose enough to fit over bandages, boots, and casts. No slip on shoes, sneakers are best. Bring your boot with you to the  surgery center/hospital.  Also bring crutches or a walker if your physician has prescribed it for you.  If you do not have this equipment, it will be provided for you after surgery. If you have not been contracted by the surgery center/hospital by the day before your surgery, call to confirm the date and time of your surgery. Leave-time from work may vary depending on the type of surgery you have.  Appropriate arrangements should be made prior to surgery with your employer. Prescriptions will be provided immediately following surgery by your doctor.  Have these filled as soon as possible after surgery and take the medication as directed. Remove nail polish on the operative foot. Wash the night before surgery.  The night before surgery wash the foot and leg well with the antibacterial soap provided and water paying special attention to beneath the toenails and in between the toes.  Rinse thoroughly with water and dry well with a towel.  Perform this wash unless told not to do so by your physician.  Enclosed: 1 Ice pack (please put in freezer the night before surgery)   1 Hibiclens skin cleaner   Pre-op Instructions  If you have any questions regarding the instructions, do not hesitate to call our office at any point during this process.   [place]: [address] [place], [place] [address] [phone]  [place]: [address] [place], [place] [address] [phone]  Dr. Jessieca Rhem, DPM  

## 2022-12-02 DIAGNOSIS — M21619 Bunion of unspecified foot: Secondary | ICD-10-CM | POA: Insufficient documentation

## 2023-02-23 ENCOUNTER — Other Ambulatory Visit: Payer: Self-pay | Admitting: Nurse Practitioner

## 2023-02-23 DIAGNOSIS — Z1231 Encounter for screening mammogram for malignant neoplasm of breast: Secondary | ICD-10-CM

## 2023-04-09 ENCOUNTER — Ambulatory Visit
Admission: RE | Admit: 2023-04-09 | Discharge: 2023-04-09 | Disposition: A | Discharge status: Home / Self Care | Payer: BC Managed Care – PPO | Source: Ambulatory Visit | Attending: Nurse Practitioner | Admitting: Nurse Practitioner

## 2023-04-09 DIAGNOSIS — Z1231 Encounter for screening mammogram for malignant neoplasm of breast: Secondary | ICD-10-CM

## 2023-07-17 IMAGING — MG DIGITAL DIAGNOSTIC BILAT W/ TOMO W/ CAD
8 series · 9 of 24 positions shown · non-contrast
Comparison: Previous exam(s).

CLINICAL DATA: Screening recall for possible bilateral breast
asymmetries.

EXAM:
DIGITAL DIAGNOSTIC BILATERAL MAMMOGRAM WITH TOMOSYNTHESIS AND CAD
TECHNIQUE: Bilateral digital diagnostic mammography and breast tomosynthesis
was performed. The images were evaluated with computer-aided
detection.

[L MLO synth-2D]
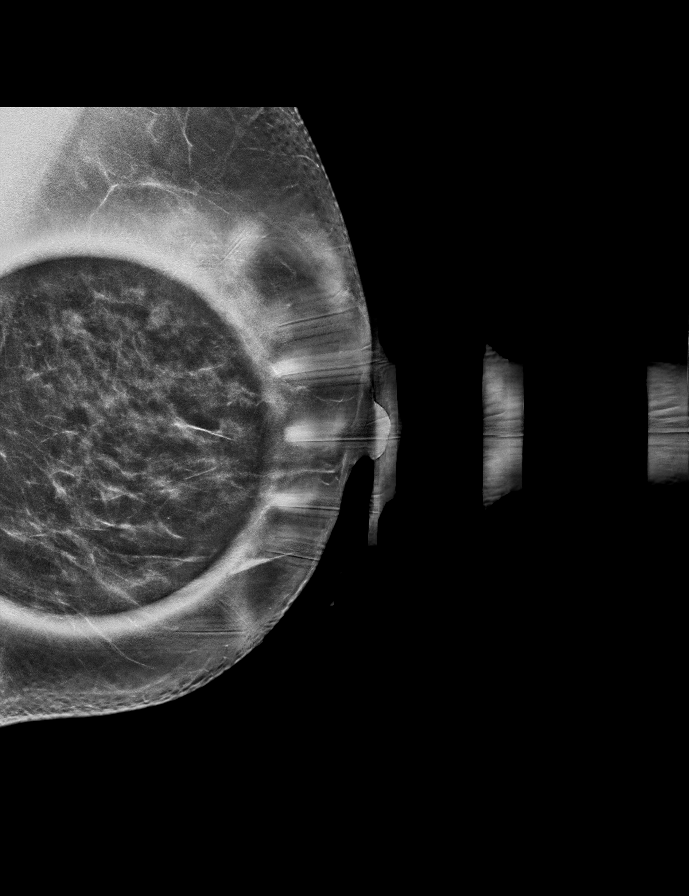

[R MLO synth-2D]
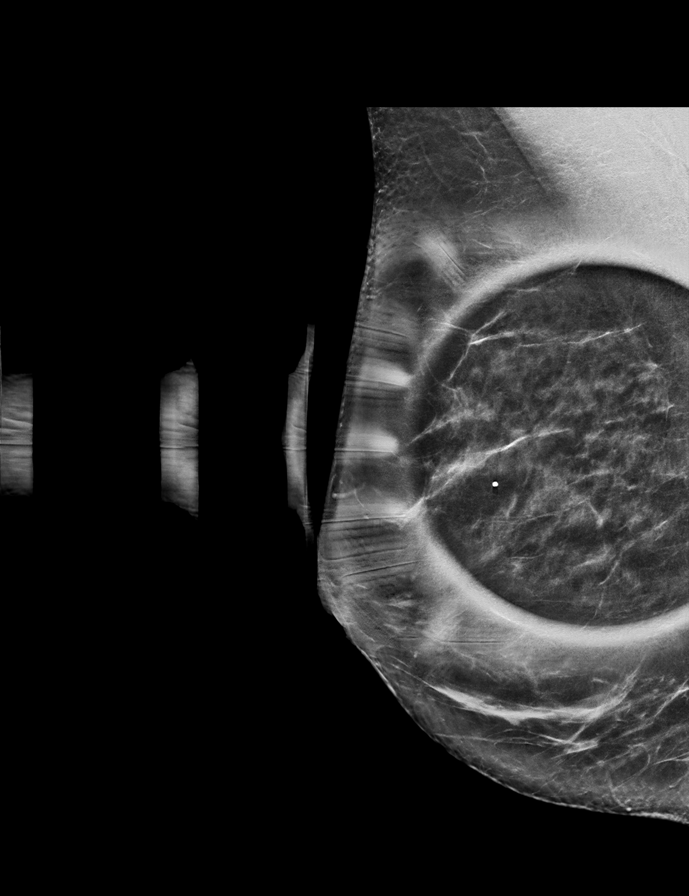

[L ML synth-2D]
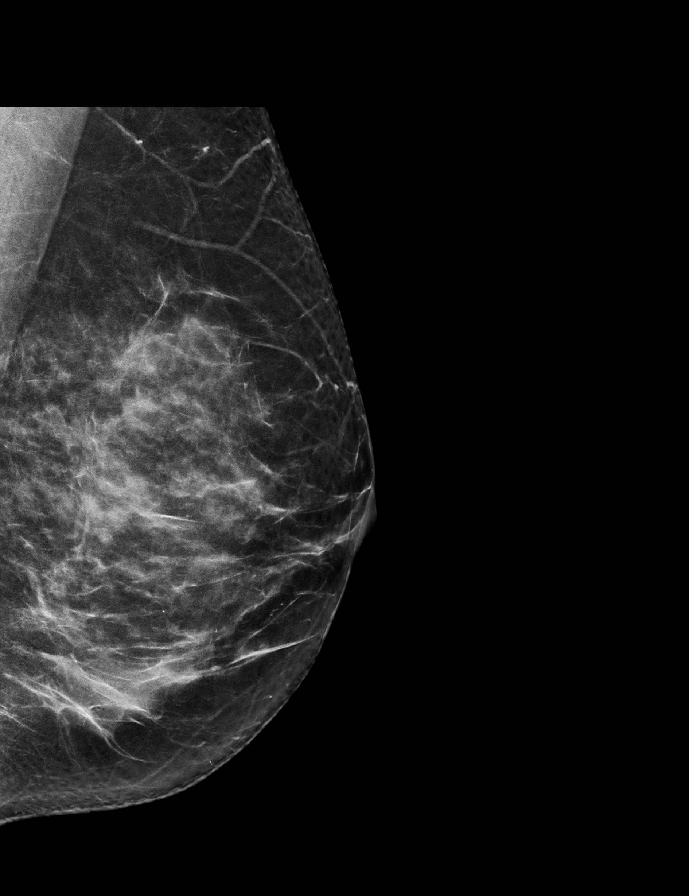

[R CC synth-2D]
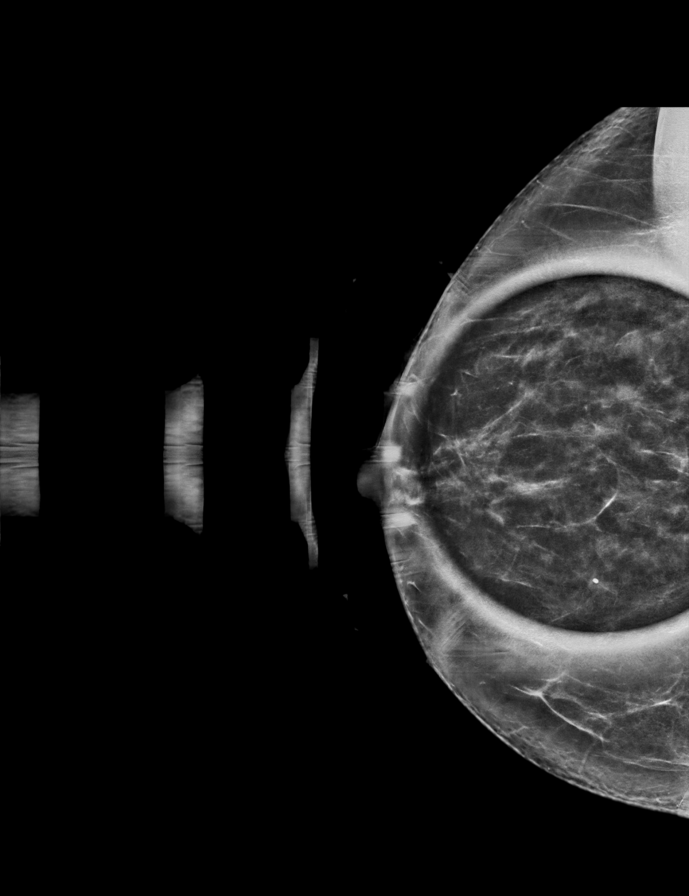

[L MLO tomo · 2 of 72 frames shown]
[frame 24/72]
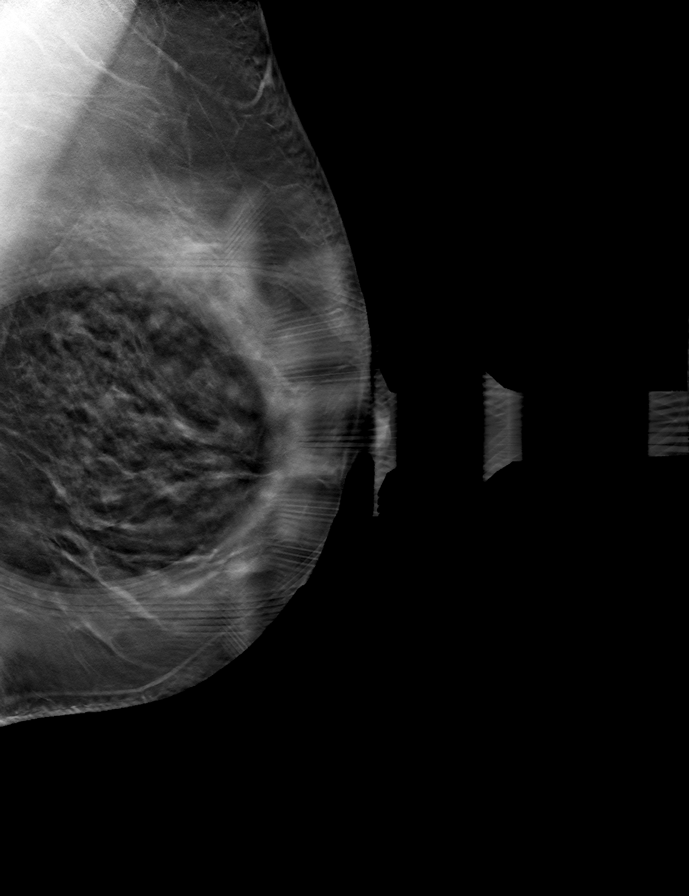
[frame 37/72]
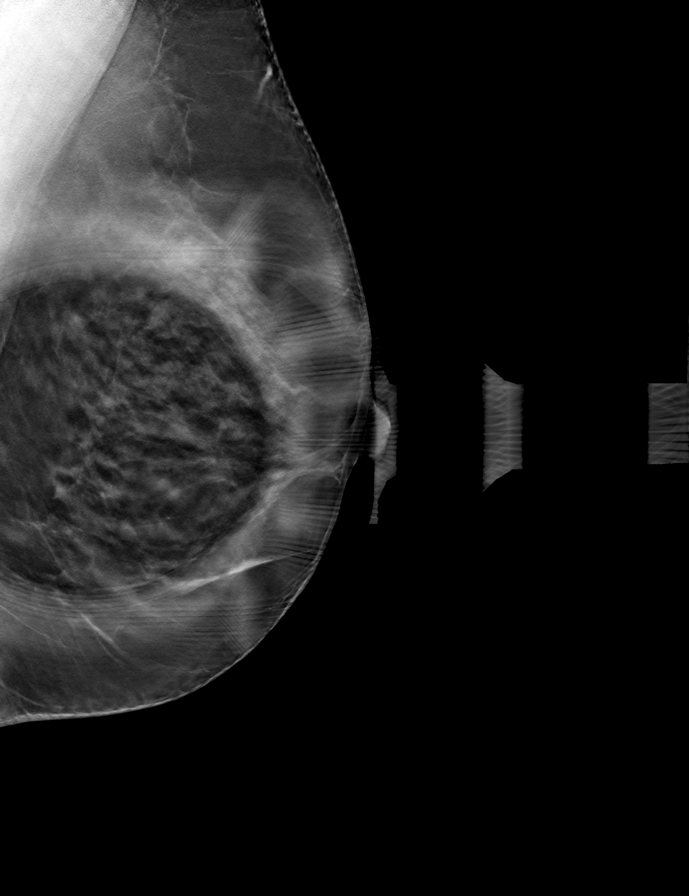

[R MLO tomo · tomo slice 38/75.0]
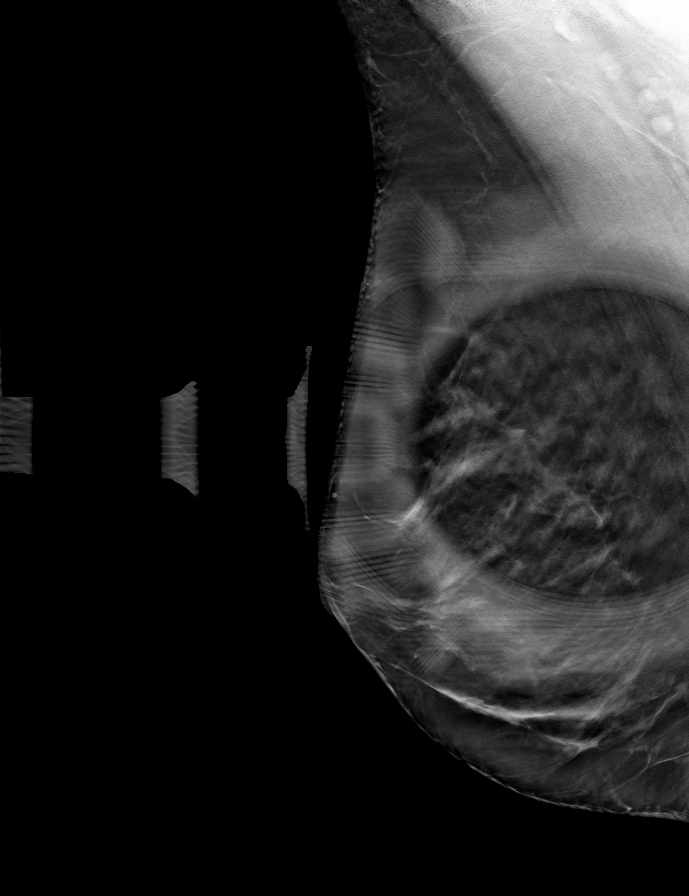

[L ML tomo · tomo slice 35/70.0]
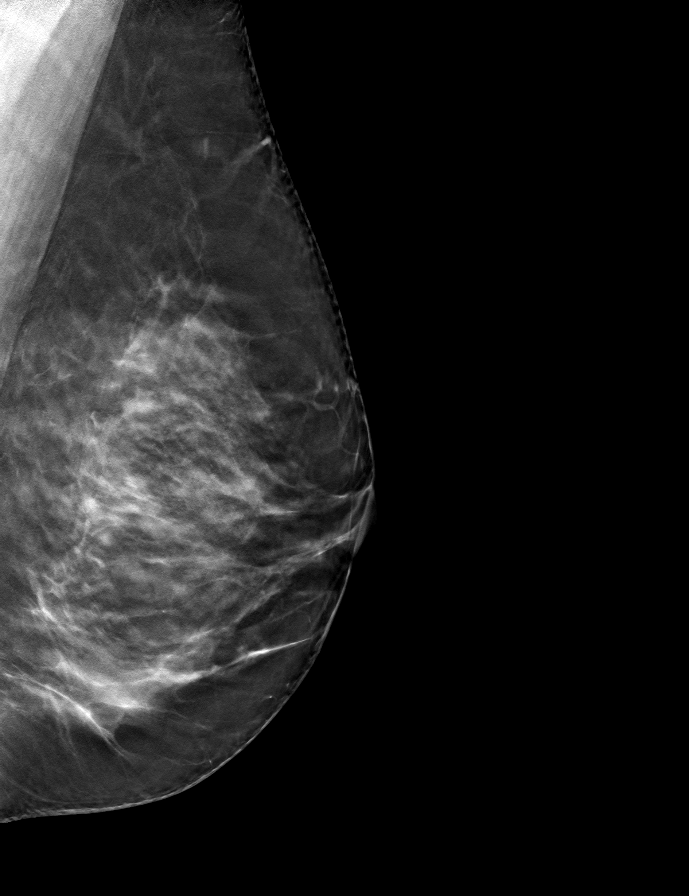

[R CC tomo · tomo slice 36/71.0]
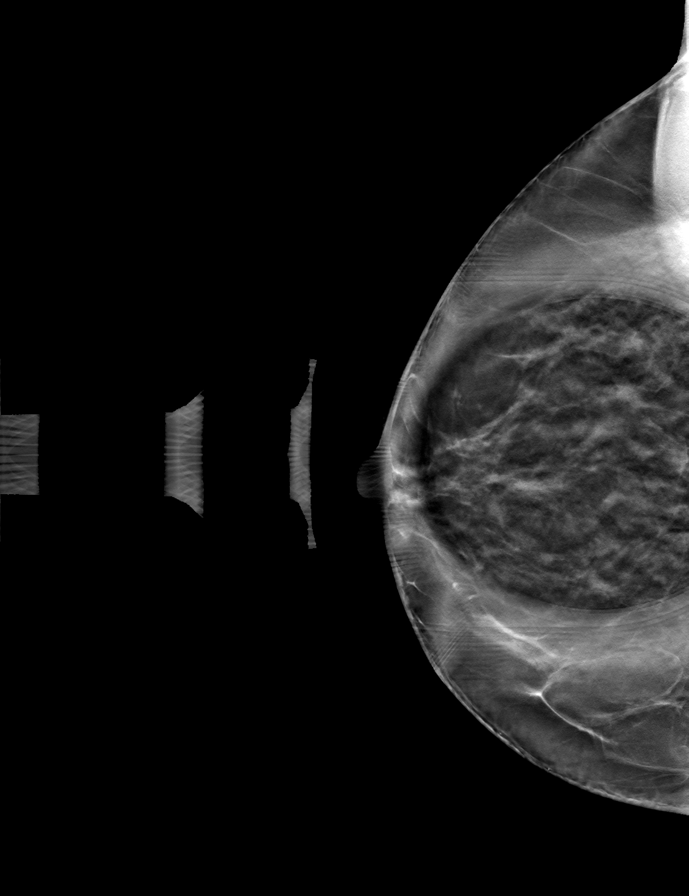

[9 of 24 positions shown; findings below may reference images not displayed]

ACR Breast Density Category c: The breast tissue is heterogeneously
dense, which may obscure small masses.
FINDINGS: In each breast, the possible asymmetries noted on the current
screening exam disperse consistent with superimposed fibroglandular
tissue. There is no underlying mass or significant residual
asymmetry. There are no areas of architectural distortion and no
suspicious calcifications.
IMPRESSION: No evidence of breast malignancy.

RECOMMENDATION:
Screening mammogram in one year.(Code:[id number])

I have discussed the findings and recommendations with the patient.
If applicable, a reminder letter will be sent to the patient
regarding the next appointment.

BI-RADS CATEGORY  1: Negative.

## 2024-03-07 ENCOUNTER — Other Ambulatory Visit: Payer: Self-pay | Admitting: Nurse Practitioner

## 2024-03-07 DIAGNOSIS — Z1231 Encounter for screening mammogram for malignant neoplasm of breast: Secondary | ICD-10-CM

## 2024-04-10 ENCOUNTER — Ambulatory Visit
Admission: RE | Admit: 2024-04-10 | Discharge: 2024-04-10 | Disposition: A | Discharge status: Home / Self Care | Source: Ambulatory Visit | Attending: Nurse Practitioner

## 2024-04-10 DIAGNOSIS — Z1231 Encounter for screening mammogram for malignant neoplasm of breast: Secondary | ICD-10-CM

## 2024-07-05 ENCOUNTER — Encounter: Payer: Self-pay | Admitting: Pediatrics

## 2024-07-27 ENCOUNTER — Other Ambulatory Visit: Payer: Self-pay | Admitting: General Surgery

## 2024-08-22 ENCOUNTER — Encounter (HOSPITAL_COMMUNITY): Payer: Self-pay | Admitting: General Surgery

## 2024-08-22 ENCOUNTER — Other Ambulatory Visit: Payer: Self-pay

## 2024-08-22 NOTE — Progress Notes (Signed)
 SDW CALL  Patient was given pre-op instructions over the phone. Patient verbalized understanding of instructions provided.   PCP - Lyle Setters FNP Cardiologist - denies   Chest x-ray -  EKG - DOS Stress Test - denies ECHO - denies Cardiac Cath - denies  Sleep Study - denies CPAP -   Fasting Blood Sugar - n/a Checks Blood Sugar _____ times a day  Blood Thinner Instructions: n/a Aspirin Instructions: No NSAIDS  ERAS Protcol - clears until 6:30am PRE-SURGERY Ensure or G2-   COVID TEST- ??per provider   Anesthesia review: YES: Pt's Son has a cold. Pt developed runny nose, sore throat, cough, swollen/tender glands yesterday on 9/22. States she did call Dr. Gelene office today--reports the office stated that they would check in on her tomorrow to make a decision whether to proceed with surgery.Pt informed that anesthesia could potentially delay her surgery based on her symptoms. Instructed to answer all phone calls.   Patient denies fever or chest pain over the phone call

## 2024-09-04 ENCOUNTER — Ambulatory Visit: Admitting: Pediatrics

## 2024-09-18 NOTE — Progress Notes (Signed)
   09/18/24 1039  PAT Phone Screen  Have You Taken Oral Steroids in the Past 3 Months? Yes (started yesterday for sore throat and cough. Spoke w/ Renda and let her know to reschedule pt when she is 2 week symptom free of upper resp symptoms. Pt aware and will also call office.)

## 2024-09-25 HISTORY — DX: Supraventricular tachycardia, unspecified: I47.10

## 2024-09-25 HISTORY — DX: Palpitations: R00.2

## 2024-09-25 HISTORY — DX: Depression, unspecified: F32.A

## 2024-10-11 ENCOUNTER — Encounter (HOSPITAL_BASED_OUTPATIENT_CLINIC_OR_DEPARTMENT_OTHER): Payer: Self-pay | Admitting: General Surgery

## 2024-10-16 NOTE — Anesthesia Preprocedure Evaluation (Signed)
 Anesthesia Evaluation  Patient identified by MRN, date of birth, ID band Patient awake    Reviewed: Allergy & Precautions, NPO status , Patient's Chart, lab work & pertinent test results  History of Anesthesia Complications (+) PONV and history of anesthetic complications  Airway Mallampati: II  TM Distance: >3 FB Neck ROM: Full    Dental no notable dental hx. (+) Implants, Dental Advisory Given, Teeth Intact   Pulmonary neg pulmonary ROS   Pulmonary exam normal breath sounds clear to auscultation       Cardiovascular (-) hypertension(-) angina (-) Past MI Normal cardiovascular exam Rhythm:Regular Rate:Normal     Neuro/Psych  Headaches PSYCHIATRIC DISORDERS Anxiety Depression       GI/Hepatic   Endo/Other  Hypothyroidism    Renal/GU      Musculoskeletal   Abdominal   Peds  Hematology   Anesthesia Other Findings All : sulfa, Buspirone  Reproductive/Obstetrics negative OB ROS                              Anesthesia Physical Anesthesia Plan  ASA: 1  Anesthesia Plan: General   Post-op Pain Management: Precedex and Tylenol PO (pre-op)*   Induction: Intravenous  PONV Risk Score and Plan: 4 or greater and Treatment may vary due to age or medical condition, Midazolam, Ondansetron, Propofol infusion and TIVA  Airway Management Planned: Oral ETT  Additional Equipment: None  Intra-op Plan:   Post-operative Plan: Extubation in OR  Informed Consent: I have reviewed the patients History and Physical, chart, labs and discussed the procedure including the risks, benefits and alternatives for the proposed anesthesia with the patient or authorized representative who has indicated his/her understanding and acceptance.     Dental advisory given  Plan Discussed with: CRNA and Surgeon  Anesthesia Plan Comments:          Anesthesia Quick Evaluation

## 2024-10-16 NOTE — H&P (Signed)
  48 year old female who presents after a couple episodes of abdominal pain. The first was early in the summer and she had epigastric pain after eating steak. This occurred for 3 hours and eventually dissipated. She then ate better for the next month and omitted alcohol. Then she had a bowl of ice cream about a month ago and a couple hours later began having epigastric pain, nausea as well as back pain. She has had a ultrasound of her abdomen that shows a small calculus in the gallbladder without any evidence of acute cholecystitis. Her liver has a 1.2 cm hypoechoic nodule as well most suggestive of a hemangioma and was present on a prior CT. She does have a CT in 2024 that is otherwise negative. She is here to discuss her options today.  Review of Systems: A complete review of systems was obtained from the patient. I have reviewed this information and discussed as appropriate with the patient. See HPI as well for other ROS.  Review of Systems  All other systems reviewed and are negative.   Medical History: Past Medical History:  Diagnosis Date  Anxiety  Thyroid  disease   There is no problem list on file for this patient.  History reviewed. No pertinent surgical history.   Allergies  Allergen Reactions  Buspirone Other (See Comments)  Suicidal thoughts  Sulfa (Sulfonamide Antibiotics) Other (See Comments) and Nausea   Current Outpatient Medications on File Prior to Visit  Medication Sig Dispense Refill  FLUoxetine (PROZAC) 40 MG capsule Take 40 mg by mouth once daily  levothyroxine (SYNTHROID) 50 MCG tablet Take 50 mcg by mouth once daily   History reviewed. No pertinent family history.   Social History   Tobacco Use  Smoking Status Never  Smokeless Tobacco Never  Marital status: Married  Tobacco Use  Smoking status: Never  Smokeless tobacco: Never  Substance and Sexual Activity  Alcohol use: Yes  Alcohol/week: 0.0 - 1.0 standard drinks of alcohol  Drug use: Never     Objective:   Vitals:  07/27/24 1510  BP: 110/60  Pulse: 86  Weight: 61.7 kg (136 lb)  Height: 160 cm (5' 3)   Body mass index is 24.09 kg/m.  Physical Exam Vitals reviewed.  Constitutional:  Appearance: Normal appearance.  Chest:  Breasts: Right: No inverted nipple, mass or nipple discharge.  Left: No inverted nipple, mass or nipple discharge.  Lymphadenopathy:  Upper Body:  Right upper body: No supraclavicular or axillary adenopathy.  Left upper body: No supraclavicular or axillary adenopathy.  Neurological:  Mental Status: She is alert.    Assessment and Plan:   Gallstones  Lap chole  I do think symptoms are related to her gallbladder. I discussed the procedure in detail. We discussed the risks and benefits of a laparoscopic cholecystectomy and possible cholangiogram including, but not limited to bleeding, infection, injury to surrounding structures such as the intestine or liver, bile leak, retained gallstones, need to convert to an open procedure, prolonged diarrhea, blood clots such as DVT, common bile duct injury, anesthesia risks, and possible need for additional procedures. We also discussed small risk of subtotal cholecystectomy. The likelihood of improvement in symptoms and return to the patient's normal status is good. We discussed the typical post-operative recovery course.

## 2024-10-17 ENCOUNTER — Other Ambulatory Visit: Payer: Self-pay

## 2024-10-17 ENCOUNTER — Encounter (HOSPITAL_BASED_OUTPATIENT_CLINIC_OR_DEPARTMENT_OTHER): Payer: Self-pay | Admitting: General Surgery

## 2024-10-17 ENCOUNTER — Ambulatory Visit (HOSPITAL_BASED_OUTPATIENT_CLINIC_OR_DEPARTMENT_OTHER)
Admission: RE | Admit: 2024-10-17 | Discharge: 2024-10-17 | Disposition: A | Discharge status: Home / Self Care | Attending: General Surgery | Admitting: General Surgery

## 2024-10-17 ENCOUNTER — Ambulatory Visit (HOSPITAL_BASED_OUTPATIENT_CLINIC_OR_DEPARTMENT_OTHER): Payer: Self-pay | Admitting: Anesthesiology

## 2024-10-17 ENCOUNTER — Encounter (HOSPITAL_BASED_OUTPATIENT_CLINIC_OR_DEPARTMENT_OTHER)
Admission: RE | Disposition: A | Discharge status: Home / Self Care | Payer: Self-pay | Source: Home / Self Care | Attending: General Surgery

## 2024-10-17 DIAGNOSIS — K811 Chronic cholecystitis: Secondary | ICD-10-CM | POA: Diagnosis not present

## 2024-10-17 DIAGNOSIS — Z419 Encounter for procedure for purposes other than remedying health state, unspecified: Secondary | ICD-10-CM

## 2024-10-17 DIAGNOSIS — R519 Headache, unspecified: Secondary | ICD-10-CM | POA: Diagnosis not present

## 2024-10-17 DIAGNOSIS — K8020 Calculus of gallbladder without cholecystitis without obstruction: Secondary | ICD-10-CM | POA: Diagnosis present

## 2024-10-17 DIAGNOSIS — Z79890 Hormone replacement therapy (postmenopausal): Secondary | ICD-10-CM | POA: Diagnosis not present

## 2024-10-17 DIAGNOSIS — K7689 Other specified diseases of liver: Secondary | ICD-10-CM | POA: Diagnosis not present

## 2024-10-17 DIAGNOSIS — F32A Depression, unspecified: Secondary | ICD-10-CM | POA: Diagnosis not present

## 2024-10-17 DIAGNOSIS — E039 Hypothyroidism, unspecified: Secondary | ICD-10-CM | POA: Insufficient documentation

## 2024-10-17 DIAGNOSIS — F419 Anxiety disorder, unspecified: Secondary | ICD-10-CM | POA: Insufficient documentation

## 2024-10-17 DIAGNOSIS — K828 Other specified diseases of gallbladder: Secondary | ICD-10-CM | POA: Insufficient documentation

## 2024-10-17 DIAGNOSIS — Z79899 Other long term (current) drug therapy: Secondary | ICD-10-CM | POA: Diagnosis not present

## 2024-10-17 HISTORY — PX: CHOLECYSTECTOMY: SHX55

## 2024-10-17 HISTORY — DX: Nausea with vomiting, unspecified: R11.2

## 2024-10-17 LAB — POCT PREGNANCY, URINE: Preg Test, Ur: NEGATIVE

## 2024-10-17 SURGERY — LAPAROSCOPIC CHOLECYSTECTOMY
Anesthesia: General | Site: Abdomen

## 2024-10-17 MED ORDER — OXYCODONE HCL 5 MG PO TABS: 5.0000 mg | ORAL_TABLET | Freq: Four times a day (QID) | ORAL | 0 refills | Status: AC | PRN

## 2024-10-17 MED ORDER — PHENYLEPHRINE 80 MCG/ML (10ML) SYRINGE FOR IV PUSH (FOR BLOOD PRESSURE SUPPORT)
PREFILLED_SYRINGE | INTRAVENOUS | Status: AC
  Filled 2024-10-17: qty 10

## 2024-10-17 MED ORDER — DEXAMETHASONE SODIUM PHOSPHATE 4 MG/ML IJ SOLN
INTRAMUSCULAR | Status: DC | PRN
  Administered 2024-10-17: 8 mg via INTRAVENOUS

## 2024-10-17 MED ORDER — PHENYLEPHRINE HCL (PRESSORS) 10 MG/ML IV SOLN
INTRAVENOUS | Status: DC | PRN
  Administered 2024-10-17: 80 ug via INTRAVENOUS
  Administered 2024-10-17: 160 ug via INTRAVENOUS

## 2024-10-17 MED ORDER — FENTANYL CITRATE (PF) 100 MCG/2ML IJ SOLN
INTRAMUSCULAR | Status: AC
  Filled 2024-10-17: qty 2

## 2024-10-17 MED ORDER — OXYCODONE HCL 5 MG PO TABS
5.0000 mg | ORAL_TABLET | Freq: Once | ORAL | Status: AC | PRN
  Administered 2024-10-17: 5 mg via ORAL

## 2024-10-17 MED ORDER — LIDOCAINE 2% (20 MG/ML) 5 ML SYRINGE
INTRAMUSCULAR | Status: AC
  Filled 2024-10-17: qty 5

## 2024-10-17 MED ORDER — SPY AGENT GREEN - (INDOCYANINE FOR INJECTION)
1.2500 mg | Freq: Once | INTRAMUSCULAR | Status: AC
  Administered 2024-10-17: 1.25 mg via INTRAVENOUS
  Filled 2024-10-17: qty 10

## 2024-10-17 MED ORDER — ONDANSETRON HCL 4 MG/2ML IJ SOLN
INTRAMUSCULAR | Status: AC
  Filled 2024-10-17: qty 4

## 2024-10-17 MED ORDER — DEXMEDETOMIDINE HCL IN NACL 80 MCG/20ML IV SOLN
INTRAVENOUS | Status: DC | PRN
  Administered 2024-10-17 (×2): 4 ug via INTRAVENOUS

## 2024-10-17 MED ORDER — CEFAZOLIN SODIUM-DEXTROSE 2-4 GM/100ML-% IV SOLN
2.0000 g | INTRAVENOUS | Status: AC
  Administered 2024-10-17: 2 g via INTRAVENOUS

## 2024-10-17 MED ORDER — BUPIVACAINE HCL (PF) 0.25 % IJ SOLN
INTRAMUSCULAR | Status: DC | PRN
  Administered 2024-10-17: 10 mL

## 2024-10-17 MED ORDER — ACETAMINOPHEN 10 MG/ML IV SOLN
INTRAVENOUS | Status: DC | PRN
  Administered 2024-10-17: 1000 mg via INTRAVENOUS

## 2024-10-17 MED ORDER — PROPOFOL 500 MG/50ML IV EMUL
INTRAVENOUS | Status: DC | PRN
  Administered 2024-10-17: 175 ug/kg/min via INTRAVENOUS

## 2024-10-17 MED ORDER — METOCLOPRAMIDE HCL 5 MG/ML IJ SOLN
INTRAMUSCULAR | Status: DC | PRN
  Administered 2024-10-17 (×2): 5 mg via INTRAVENOUS

## 2024-10-17 MED ORDER — ARTIFICIAL TEARS OPHTHALMIC OINT
TOPICAL_OINTMENT | OPHTHALMIC | Status: AC
  Filled 2024-10-17: qty 14

## 2024-10-17 MED ORDER — KETOROLAC TROMETHAMINE 30 MG/ML IJ SOLN
INTRAMUSCULAR | Status: DC | PRN
  Administered 2024-10-17: 30 mg via INTRAVENOUS

## 2024-10-17 MED ORDER — OXYCODONE HCL 5 MG/5ML PO SOLN: 5.0000 mg | Freq: Once | ORAL | Status: AC | PRN

## 2024-10-17 MED ORDER — ACETAMINOPHEN 10 MG/ML IV SOLN
INTRAVENOUS | Status: AC
  Filled 2024-10-17: qty 100

## 2024-10-17 MED ORDER — EPHEDRINE SULFATE (PRESSORS) 25 MG/5ML IV SOSY
PREFILLED_SYRINGE | INTRAVENOUS | Status: DC | PRN
  Administered 2024-10-17: 5 mg via INTRAVENOUS

## 2024-10-17 MED ORDER — AMISULPRIDE (ANTIEMETIC) 5 MG/2ML IV SOLN: 10.0000 mg | Freq: Once | INTRAVENOUS | Status: DC | PRN

## 2024-10-17 MED ORDER — CEFAZOLIN SODIUM-DEXTROSE 2-4 GM/100ML-% IV SOLN
INTRAVENOUS | Status: AC
  Filled 2024-10-17: qty 100

## 2024-10-17 MED ORDER — CHLORHEXIDINE GLUCONATE CLOTH 2 % EX PADS: 6.0000 | MEDICATED_PAD | Freq: Once | CUTANEOUS | Status: DC

## 2024-10-17 MED ORDER — KETOROLAC TROMETHAMINE 30 MG/ML IJ SOLN
INTRAMUSCULAR | Status: AC
  Filled 2024-10-17: qty 1

## 2024-10-17 MED ORDER — EPHEDRINE 5 MG/ML INJ
INTRAVENOUS | Status: AC
Start: 2024-10-17 — End: 2024-10-17
  Filled 2024-10-17: qty 5

## 2024-10-17 MED ORDER — ENSURE PRE-SURGERY PO LIQD: 296.0000 mL | Freq: Once | ORAL | Status: DC

## 2024-10-17 MED ORDER — ARTIFICIAL TEARS OPHTHALMIC OINT
TOPICAL_OINTMENT | OPHTHALMIC | Status: AC
  Filled 2024-10-17: qty 3.5

## 2024-10-17 MED ORDER — LACTATED RINGERS IV SOLN: INTRAVENOUS | Status: DC

## 2024-10-17 MED ORDER — ONDANSETRON HCL 4 MG/2ML IJ SOLN
INTRAMUSCULAR | Status: AC
Start: 2024-10-17 — End: 2024-10-17
  Filled 2024-10-17: qty 2

## 2024-10-17 MED ORDER — SODIUM CHLORIDE 0.9 % IR SOLN
Status: DC | PRN
  Administered 2024-10-17: 1000 mL

## 2024-10-17 MED ORDER — MIDAZOLAM HCL 2 MG/2ML IJ SOLN
INTRAMUSCULAR | Status: AC
  Filled 2024-10-17: qty 2

## 2024-10-17 MED ORDER — FENTANYL CITRATE (PF) 100 MCG/2ML IJ SOLN
INTRAMUSCULAR | Status: DC | PRN
  Administered 2024-10-17: 100 ug via INTRAVENOUS

## 2024-10-17 MED ORDER — KETOROLAC TROMETHAMINE 30 MG/ML IJ SOLN
INTRAMUSCULAR | Status: AC
  Filled 2024-10-17: qty 2

## 2024-10-17 MED ORDER — MIDAZOLAM HCL (PF) 2 MG/2ML IJ SOLN
INTRAMUSCULAR | Status: DC | PRN
  Administered 2024-10-17: 2 mg via INTRAVENOUS

## 2024-10-17 MED ORDER — FENTANYL CITRATE (PF) 100 MCG/2ML IJ SOLN
25.0000 ug | INTRAMUSCULAR | Status: DC | PRN
  Administered 2024-10-17 (×2): 25 ug via INTRAVENOUS

## 2024-10-17 MED ORDER — METOCLOPRAMIDE HCL 5 MG/ML IJ SOLN
INTRAMUSCULAR | Status: AC
  Filled 2024-10-17: qty 2

## 2024-10-17 MED ORDER — LIDOCAINE HCL (CARDIAC) PF 100 MG/5ML IV SOSY
PREFILLED_SYRINGE | INTRAVENOUS | Status: DC | PRN
  Administered 2024-10-17: 100 mg via INTRAVENOUS

## 2024-10-17 MED ORDER — ONDANSETRON HCL 4 MG/2ML IJ SOLN
INTRAMUSCULAR | Status: DC | PRN
  Administered 2024-10-17: 4 mg via INTRAVENOUS

## 2024-10-17 MED ORDER — ROCURONIUM BROMIDE 100 MG/10ML IV SOLN
INTRAVENOUS | Status: DC | PRN
  Administered 2024-10-17: 50 mg via INTRAVENOUS

## 2024-10-17 MED ORDER — OXYCODONE HCL 5 MG PO TABS
ORAL_TABLET | ORAL | Status: AC
  Filled 2024-10-17: qty 1

## 2024-10-17 MED ORDER — PROPOFOL 10 MG/ML IV BOLUS
INTRAVENOUS | Status: DC | PRN
Start: 2024-10-17 — End: 2024-10-17
  Administered 2024-10-17: 150 mg via INTRAVENOUS

## 2024-10-17 MED ORDER — ROCURONIUM BROMIDE 10 MG/ML (PF) SYRINGE
PREFILLED_SYRINGE | INTRAVENOUS | Status: AC
  Filled 2024-10-17: qty 10

## 2024-10-17 MED ORDER — SUGAMMADEX SODIUM 200 MG/2ML IV SOLN
INTRAVENOUS | Status: DC | PRN
  Administered 2024-10-17: 200 mg via INTRAVENOUS

## 2024-10-17 SURGICAL SUPPLY — 35 items
BLADE CLIPPER SURG (BLADE) IMPLANT
CANISTER SUCT 1200ML W/VALVE (MISCELLANEOUS) ×1 IMPLANT
CHLORAPREP W/TINT 26 (MISCELLANEOUS) ×1 IMPLANT
CLIP APPLIE 5 13 M/L LIGAMAX5 (MISCELLANEOUS) ×1 IMPLANT
COVER MAYO STAND STRL (DRAPES) IMPLANT
DERMABOND ADVANCED .7 DNX12 (GAUZE/BANDAGES/DRESSINGS) ×1 IMPLANT
DEVICE TROCAR PUNCTURE CLOSURE (ENDOMECHANICALS) IMPLANT
DRAPE C-ARM 42X72 X-RAY (DRAPES) IMPLANT
DRAPE LAPAROSCOPIC ABDOMINAL (DRAPES) ×1 IMPLANT
ELECTRODE REM PT RTRN 9FT ADLT (ELECTROSURGICAL) ×1 IMPLANT
GAUZE 4X4 16PLY ~~LOC~~+RFID DBL (SPONGE) ×1 IMPLANT
GLOVE BIO SURGEON STRL SZ7 (GLOVE) ×1 IMPLANT
GLOVE BIOGEL PI IND STRL 7.5 (GLOVE) ×1 IMPLANT
GOWN STRL REUS W/ TWL LRG LVL3 (GOWN DISPOSABLE) ×3 IMPLANT
GRASPER SUT TROCAR 14GX15 (MISCELLANEOUS) IMPLANT
HEMOSTAT SNOW SURGICEL 2X4 (HEMOSTASIS) IMPLANT
IRRIGATION SUCT STRKRFLW 2 WTP (MISCELLANEOUS) ×1 IMPLANT
KIT IMAGING PINPOINTPAQ (MISCELLANEOUS) ×1 IMPLANT
PACK BASIN DAY SURGERY FS (CUSTOM PROCEDURE TRAY) ×1 IMPLANT
POUCH RETRIEVAL ECOSAC 10 (ENDOMECHANICALS) ×1 IMPLANT
SCISSORS LAP 5X35 DISP (ENDOMECHANICALS) ×1 IMPLANT
SET CHOLANGIOGRAPH 5 50 .035 (SET/KITS/TRAYS/PACK) IMPLANT
SET TUBE SMOKE EVAC HIGH FLOW (TUBING) ×1 IMPLANT
SLEEVE SCD COMPRESS KNEE MED (STOCKING) ×1 IMPLANT
SLEEVE Z-THREAD 5X100MM (TROCAR) ×2 IMPLANT
SOLN 0.9% NACL POUR BTL 1000ML (IV SOLUTION) ×1 IMPLANT
SPIKE FLUID TRANSFER (MISCELLANEOUS) IMPLANT
STRIP CLOSURE SKIN 1/2X4 (GAUZE/BANDAGES/DRESSINGS) ×1 IMPLANT
SUT MNCRL AB 4-0 PS2 18 (SUTURE) ×1 IMPLANT
SUT VICRYL 0 UR6 27IN ABS (SUTURE) IMPLANT
TOWEL GREEN STERILE FF (TOWEL DISPOSABLE) ×2 IMPLANT
TRAY LAPAROSCOPIC (CUSTOM PROCEDURE TRAY) ×1 IMPLANT
TROCAR BALLN 12MMX100 BLUNT (TROCAR) ×1 IMPLANT
TROCAR Z-THREAD OPTICAL 5X100M (TROCAR) ×1 IMPLANT
TUBE CONNECTING 20X1/4 (TUBING) ×1 IMPLANT

## 2024-10-17 NOTE — Interval H&P Note (Signed)
 History and Physical Interval Note:  10/17/2024 10:58 AM  Katelyn George  has presented today for surgery, with the diagnosis of Gallstones.  The various methods of treatment have been discussed with the patient and family. After consideration of risks, benefits and other options for treatment, the patient has consented to  Procedure(s) with comments: LAPAROSCOPIC CHOLECYSTECTOMY (N/A) - ICG DYE as a surgical intervention.  The patient's history has been reviewed, patient examined, no change in status, stable for surgery.  I have reviewed the patient's chart and labs.  Questions were answered to the patient's satisfaction.     Donnice Bury

## 2024-10-17 NOTE — Transfer of Care (Signed)
 Immediate Anesthesia Transfer of Care Note  Patient: Katelyn George  Procedure(s) Performed: LAPAROSCOPIC CHOLECYSTECTOMY (Abdomen)  Patient Location: PACU  Anesthesia Type:General  Level of Consciousness: awake and patient cooperative  Airway & Oxygen Therapy: Patient Spontanous Breathing and Patient connected to nasal cannula oxygen  Post-op Assessment: Report given to RN and Post -op Vital signs reviewed and stable  Post vital signs: Reviewed and stable  Last Vitals:  Vitals Value Taken Time  BP 97/46 10/17/24 13:11  Temp    Pulse 81 10/17/24 13:15  Resp 20 10/17/24 13:15  SpO2 95 % 10/17/24 13:15  Vitals shown include unfiled device data.  Last Pain:  Vitals:   10/17/24 1010  TempSrc: Temporal  PainSc: 0-No pain      Patients Stated Pain Goal: 4 (10/17/24 1010)  Complications: No notable events documented.

## 2024-10-17 NOTE — Anesthesia Procedure Notes (Addendum)
 Procedure Name: Intubation Date/Time: 10/17/2024 12:01 PM  Performed by: Donnell Berwyn SQUIBB, CRNAPre-anesthesia Checklist: Patient identified, Emergency Drugs available, Suction available, Patient being monitored and Timeout performed Patient Re-evaluated:Patient Re-evaluated prior to induction Oxygen Delivery Method: Circle system utilized Preoxygenation: Pre-oxygenation with 100% oxygen Induction Type: IV induction Ventilation: Mask ventilation without difficulty Laryngoscope Size: Mac and 3 Grade View: Grade I Tube type: Oral Tube size: 7.0 mm Number of attempts: 1 Airway Equipment and Method: Stylet Placement Confirmation: ETT inserted through vocal cords under direct vision, positive ETCO2 and breath sounds checked- equal and bilateral Secured at: 20 cm Tube secured with: Tape Dental Injury: Teeth and Oropharynx as per pre-operative assessment

## 2024-10-17 NOTE — Op Note (Signed)
 Preoperative diagnosis: biliary colic Postoperative diagnosis chronic cholecystitis Procedure: Laparoscopic cholecystectomy Surgeon: Dr. Adina Bury Anesthesia: General  Complications: None Drains: None Estimated blood loss: Minimal Specimens: Gallbladder and contents to pathology Sponge count was correct at completion Disposition recovery stable   Indications:48 year old female who presents after a couple episodes of abdominal pain. The first was early in the summer and she had epigastric pain after eating steak. This occurred for 3 hours and eventually dissipated. She then ate better for the next month and omitted alcohol. Then she had a bowl of ice cream about a month ago and a couple hours later began having epigastric pain, nausea as well as back pain. She has had a ultrasound of her abdomen that shows a small calculus in the gallbladder without any evidence of acute cholecystitis. Her liver has a 1.2 cm hypoechoic nodule as well most suggestive of a hemangioma and was present on a prior CT. She does have a CT in 2024 that is otherwise negative. We discussed proceeding with lap chole.    Procedure: After informed consent was obtained she was taken to the operating room.  She was given antibiotics.  SCDs were placed.  She was placed under anesthesia without complication.  She was prepped and draped in a standard sterile surgical fashion.  A surgical timeout was then performed.   I infiltrated marcaine below the umbilicus and made a vertical incision. I then grasped the fascia and incised it.  I entered the peritoneum bluntly and placed a 0 vicryl pursestring suture.  I then inserted a hasson trocar and insufflated the abdomen to 15 mm Hg pressure. I then inserted three additional 5 mm trocars in the epigastrium and right side of the abdomen. The gallbladder was then retracted cephalad and lateral. She did have evidence of chronic cholecystitis.  I had to dissect some adhesions to her duodenum.    I was then able to dissect the triangle and clearly obtain a critical view of safety.  I confirmed my anatomy with the ICG dye as I saw the common duct and the cystic duct. I took the gallbladder off the liver bed for some distance just to confirm this clearly obtaining the critical view of safety.  I then divided the posterior and anterior branches of the artery after placing clips leaving two in place.  I then clipped the duct with three clips. I divided the duct leaving two in place. The clips completely traversed the duct and the duct was viable.the gallbladder was then removed from the liver bed.  The gallbladder was then placed in a retrieval bag.  I obtained hemostasis. I then removed the gallbladder in the retrieval bag.  The umbilical  trocar was removed.I tied my pursestring down and then placed an additional 0 vicryl suture to completely obliterate the defect.  I then removed the remaining trocars and desufflated the abdomen.  These were closed with 4-0 Monocryl and glue.  She tolerated this well was extubated and transferred recovery stable

## 2024-10-17 NOTE — Anesthesia Postprocedure Evaluation (Signed)
 Anesthesia Post Note  Patient: Katelyn George  Procedure(s) Performed: LAPAROSCOPIC CHOLECYSTECTOMY (Abdomen)     Patient location during evaluation: PACU Anesthesia Type: General Level of consciousness: awake and alert Pain management: pain level controlled Vital Signs Assessment: post-procedure vital signs reviewed and stable Respiratory status: spontaneous breathing, nonlabored ventilation, respiratory function stable and patient connected to nasal cannula oxygen Cardiovascular status: blood pressure returned to baseline and stable Postop Assessment: no apparent nausea or vomiting Anesthetic complications: no   No notable events documented.  Last Vitals:  Vitals:   10/17/24 1402 10/17/24 1413  BP: (!) 86/62 101/61  Pulse: 92 76  Resp: (!) 26 16  Temp:    SpO2: 95% 98%    Last Pain:  Vitals:   10/17/24 1413  TempSrc:   PainSc: 5                  Jessilynn Taft L Ulrich Soules

## 2024-10-17 NOTE — Discharge Instructions (Addendum)
 [facility] LAPAROSCOPIC SURGERY: POST OP INSTRUCTIONS  Always review your discharge instruction sheet given to you by the facility where your surgery was performed. IF YOU HAVE DISABILITY OR FAMILY LEAVE FORMS, YOU MUST BRING THEM TO THE OFFICE FOR PROCESSING.   DO NOT GIVE THEM TO YOUR DOCTOR.  A prescription for pain medication may be given to you upon discharge.  Take your pain medication as prescribed, if needed.  If narcotic pain medicine is not needed, then you may take acetaminophen (Tylenol), naprosyn (Alleve), or ibuprofen (Advil) as needed. *No Tylenol until after 6:45 pm *No Ibuprofen/Advil until after 8:45 pm Take your usually prescribed medications unless otherwise directed. If you need a refill on your pain medication, please contact your pharmacy.  They will contact our office to request authorization. Prescriptions will not be filled after 5pm or on week-ends. You should follow a light diet the first few days after arrival home, such as soup and crackers, etc.  Be sure to include lots of fluids daily. Most patients will experience some swelling and bruising in the area of the incisions.  Ice packs will help.  Swelling and bruising can take several days to resolve.  It is common to experience some constipation if taking pain medication after surgery.  Increasing fluid intake and taking a stool softener (such as Colace) will usually help or prevent this problem from occurring.  A mild laxative (Milk of Magnesia or Miralax) should be taken according to package instructions if there are no bowel movements after 48 hours. Unless discharge instructions indicate otherwise, you may remove your bandages 48 hours after surgery, and you may shower at that time.  You may have steri-strips (small skin tapes) in place directly over the incision.  These strips should be left on the skin for 7-10 days.  If your surgeon used skin glue on the  incision, you may shower in 24 hours.  The glue will flake off over the next 2-3 weeks.  Any sutures or staples will be removed at the office during your follow-up visit. ACTIVITIES:  You may resume regular (light) daily activities beginning the next day--such as daily self-care, walking, climbing stairs--gradually increasing activities as tolerated.  You may have sexual intercourse when it is comfortable.  Refrain from any heavy lifting or straining until approved by your doctor. You may drive when you are no longer taking prescription pain medication, you can comfortably wear a seatbelt, and you can safely maneuver your car and apply brakes. RETURN TO WORK:  __________________________________________________________ Katelyn George should see your doctor in the office for a follow-up appointment approximately 2-3 weeks after your surgery.  Make sure that you call for this appointment within a day or two after you arrive home to insure a convenient appointment time. OTHER INSTRUCTIONS: __________________________________________________________________________________________________________________________ __________________________________________________________________________________________________________________________ WHEN TO CALL YOUR DOCTOR: Fever over 101.0 Inability to urinate Continued bleeding from incision. Increased pain, redness, or drainage from the incision. Increasing abdominal pain  The clinic staff is available to answer your questions during regular business hours.  Please don't hesitate to call and ask to speak to one of the nurses for clinical concerns.  If you have a medical emergency, go to the nearest emergency room or call 911.  A surgeon from [facility] is always on call at the hospital. [address], [place], [place]  [address] ? [address] [address], [place], [place]   [address] [phone] ? [phone] ? FAX [phone] Web site:  [email or web address]  Post Anesthesia Home Care Instructions  Activity: Get plenty of rest for the remainder of the day. A responsible individual must stay with you for 24 hours following the procedure.  For the next 24 hours, DO NOT: -Drive a car -Advertising copywriter -Drink alcoholic beverages -Take any medication unless instructed by your physician -Make any legal decisions or sign important papers.  Meals: Start with liquid foods such as gelatin or soup. Progress to regular foods as tolerated. Avoid greasy, spicy, heavy foods. If nausea and/or vomiting occur, drink only clear liquids until the nausea and/or vomiting subsides. Call your physician if vomiting continues.  Special Instructions/Symptoms: Your throat may feel dry or sore from the anesthesia or the breathing tube placed in your throat during surgery. If this causes discomfort, gargle with warm salt water. The discomfort should disappear within 24 hours.  If you had a scopolamine patch placed behind your ear for the management of post- operative nausea and/or vomiting:  1. The medication in the patch is effective for 72 hours, after which it should be removed.  Wrap patch in a tissue and discard in the trash. Wash hands thoroughly with soap and water. 2. You may remove the patch earlier than 72 hours if you experience unpleasant side effects which may include dry mouth, dizziness or visual disturbances. 3. Avoid touching the patch. Wash your hands with soap and water after contact with the patch.

## 2024-10-18 ENCOUNTER — Encounter (HOSPITAL_BASED_OUTPATIENT_CLINIC_OR_DEPARTMENT_OTHER): Payer: Self-pay | Admitting: General Surgery

## 2024-10-19 LAB — SURGICAL PATHOLOGY
